# Patient Record
Sex: Female | Born: 1965
Health system: Southern US, Community
[De-identification: ages and names within clinical notes are randomized; demographics above are authoritative.]

## PROBLEM LIST (undated history)

## (undated) DIAGNOSIS — J45909 Unspecified asthma, uncomplicated: Secondary | ICD-10-CM

## (undated) DIAGNOSIS — D219 Benign neoplasm of connective and other soft tissue, unspecified: Secondary | ICD-10-CM

## (undated) DIAGNOSIS — E119 Type 2 diabetes mellitus without complications: Secondary | ICD-10-CM

## (undated) DIAGNOSIS — Z87442 Personal history of urinary calculi: Secondary | ICD-10-CM

## (undated) HISTORY — DX: Benign neoplasm of connective and other soft tissue, unspecified: D21.9

## (undated) HISTORY — PX: TONSILLECTOMY: SUR1361

## (undated) HISTORY — PX: DENTAL SURGERY: SHX609

## (undated) HISTORY — DX: Personal history of urinary calculi: Z87.442

---

## 2014-02-11 HISTORY — PX: LITHOTRIPSY: SUR834

## 2014-03-11 DIAGNOSIS — N200 Calculus of kidney: Secondary | ICD-10-CM | POA: Insufficient documentation

## 2014-04-07 DIAGNOSIS — E119 Type 2 diabetes mellitus without complications: Secondary | ICD-10-CM | POA: Insufficient documentation

## 2014-04-07 DIAGNOSIS — E559 Vitamin D deficiency, unspecified: Secondary | ICD-10-CM | POA: Insufficient documentation

## 2015-11-07 DIAGNOSIS — E119 Type 2 diabetes mellitus without complications: Secondary | ICD-10-CM | POA: Diagnosis not present

## 2015-11-07 DIAGNOSIS — J45909 Unspecified asthma, uncomplicated: Secondary | ICD-10-CM | POA: Diagnosis not present

## 2016-02-05 DIAGNOSIS — N202 Calculus of kidney with calculus of ureter: Secondary | ICD-10-CM | POA: Diagnosis not present

## 2016-02-05 DIAGNOSIS — J069 Acute upper respiratory infection, unspecified: Secondary | ICD-10-CM | POA: Diagnosis not present

## 2016-04-05 DIAGNOSIS — N281 Cyst of kidney, acquired: Secondary | ICD-10-CM | POA: Diagnosis not present

## 2016-04-05 DIAGNOSIS — K760 Fatty (change of) liver, not elsewhere classified: Secondary | ICD-10-CM | POA: Diagnosis not present

## 2016-04-05 DIAGNOSIS — N2889 Other specified disorders of kidney and ureter: Secondary | ICD-10-CM | POA: Diagnosis not present

## 2016-04-05 DIAGNOSIS — N200 Calculus of kidney: Secondary | ICD-10-CM | POA: Diagnosis not present

## 2016-04-23 DIAGNOSIS — B9689 Other specified bacterial agents as the cause of diseases classified elsewhere: Secondary | ICD-10-CM | POA: Diagnosis not present

## 2016-04-23 DIAGNOSIS — L918 Other hypertrophic disorders of the skin: Secondary | ICD-10-CM | POA: Diagnosis not present

## 2016-04-23 DIAGNOSIS — L02821 Furuncle of head [any part, except face]: Secondary | ICD-10-CM | POA: Diagnosis not present

## 2016-04-23 DIAGNOSIS — Z1283 Encounter for screening for malignant neoplasm of skin: Secondary | ICD-10-CM | POA: Diagnosis not present

## 2016-04-30 DIAGNOSIS — E1165 Type 2 diabetes mellitus with hyperglycemia: Secondary | ICD-10-CM | POA: Diagnosis not present

## 2016-04-30 DIAGNOSIS — Z7984 Long term (current) use of oral hypoglycemic drugs: Secondary | ICD-10-CM | POA: Diagnosis not present

## 2016-04-30 DIAGNOSIS — J45909 Unspecified asthma, uncomplicated: Secondary | ICD-10-CM | POA: Diagnosis not present

## 2016-05-09 DIAGNOSIS — N200 Calculus of kidney: Secondary | ICD-10-CM | POA: Diagnosis not present

## 2016-05-09 DIAGNOSIS — N281 Cyst of kidney, acquired: Secondary | ICD-10-CM | POA: Diagnosis not present

## 2016-06-04 DIAGNOSIS — K080 Exfoliation of teeth due to systemic causes: Secondary | ICD-10-CM | POA: Diagnosis not present

## 2016-06-27 DIAGNOSIS — K080 Exfoliation of teeth due to systemic causes: Secondary | ICD-10-CM | POA: Diagnosis not present

## 2016-07-17 DIAGNOSIS — Z0000 Encounter for general adult medical examination without abnormal findings: Secondary | ICD-10-CM | POA: Diagnosis not present

## 2016-07-17 DIAGNOSIS — E119 Type 2 diabetes mellitus without complications: Secondary | ICD-10-CM | POA: Diagnosis not present

## 2016-07-17 DIAGNOSIS — J45909 Unspecified asthma, uncomplicated: Secondary | ICD-10-CM | POA: Diagnosis not present

## 2016-07-17 DIAGNOSIS — E559 Vitamin D deficiency, unspecified: Secondary | ICD-10-CM | POA: Diagnosis not present

## 2016-07-17 DIAGNOSIS — Z23 Encounter for immunization: Secondary | ICD-10-CM | POA: Diagnosis not present

## 2016-08-09 DIAGNOSIS — E119 Type 2 diabetes mellitus without complications: Secondary | ICD-10-CM | POA: Diagnosis not present

## 2016-08-27 DIAGNOSIS — Z01419 Encounter for gynecological examination (general) (routine) without abnormal findings: Secondary | ICD-10-CM | POA: Diagnosis not present

## 2016-09-03 ENCOUNTER — Ambulatory Visit: Payer: Federal, State, Local not specified - PPO | Admitting: *Deleted

## 2016-09-12 ENCOUNTER — Other Ambulatory Visit: Payer: Self-pay | Admitting: Family Medicine

## 2016-09-12 DIAGNOSIS — Z1231 Encounter for screening mammogram for malignant neoplasm of breast: Secondary | ICD-10-CM

## 2016-09-20 ENCOUNTER — Ambulatory Visit
Admission: RE | Admit: 2016-09-20 | Discharge: 2016-09-20 | Disposition: A | Discharge status: Home / Self Care | Payer: Federal, State, Local not specified - PPO | Source: Ambulatory Visit | Attending: Family Medicine | Admitting: Family Medicine

## 2016-09-20 ENCOUNTER — Encounter: Payer: Self-pay | Admitting: Radiology

## 2016-09-20 DIAGNOSIS — Z1231 Encounter for screening mammogram for malignant neoplasm of breast: Secondary | ICD-10-CM | POA: Diagnosis not present

## 2016-10-30 ENCOUNTER — Encounter (HOSPITAL_COMMUNITY): Payer: Self-pay | Admitting: Emergency Medicine

## 2016-10-30 ENCOUNTER — Emergency Department (HOSPITAL_COMMUNITY): Payer: Federal, State, Local not specified - PPO

## 2016-10-30 DIAGNOSIS — K0889 Other specified disorders of teeth and supporting structures: Secondary | ICD-10-CM | POA: Insufficient documentation

## 2016-10-30 DIAGNOSIS — E119 Type 2 diabetes mellitus without complications: Secondary | ICD-10-CM | POA: Diagnosis not present

## 2016-10-30 DIAGNOSIS — J45909 Unspecified asthma, uncomplicated: Secondary | ICD-10-CM | POA: Diagnosis not present

## 2016-10-30 DIAGNOSIS — R079 Chest pain, unspecified: Secondary | ICD-10-CM | POA: Diagnosis not present

## 2016-10-30 DIAGNOSIS — R072 Precordial pain: Secondary | ICD-10-CM | POA: Diagnosis not present

## 2016-10-30 LAB — CBC
HEMATOCRIT: 38.2 % (ref 36.0–46.0)
Hemoglobin: 12.5 g/dL (ref 12.0–15.0)
MCH: 26.8 pg (ref 26.0–34.0)
MCHC: 32.7 g/dL (ref 30.0–36.0)
MCV: 82 fL (ref 78.0–100.0)
PLATELETS: 400 10*3/uL (ref 150–400)
RBC: 4.66 MIL/uL (ref 3.87–5.11)
RDW: 13.5 % (ref 11.5–15.5)
WBC: 14 10*3/uL — ABNORMAL HIGH (ref 4.0–10.5)

## 2016-10-30 LAB — BASIC METABOLIC PANEL
Anion gap: 8 (ref 5–15)
BUN: 12 mg/dL (ref 6–20)
CHLORIDE: 102 mmol/L (ref 101–111)
CO2: 27 mmol/L (ref 22–32)
CREATININE: 0.73 mg/dL (ref 0.44–1.00)
Calcium: 8.8 mg/dL — ABNORMAL LOW (ref 8.9–10.3)
GFR calc non Af Amer: >60 mL/min (ref >60)
Glucose, Bld: 158 mg/dL — ABNORMAL HIGH (ref 65–99)
POTASSIUM: 3.7 mmol/L (ref 3.5–5.1)
Sodium: 137 mmol/L (ref 135–145)

## 2016-10-30 LAB — I-STAT TROPONIN, ED: Troponin i, poc: 0.01 ng/mL (ref 0.00–0.08)

## 2016-10-30 IMAGING — CR DG CHEST 2V
2 series · 2 of 2 positions shown · non-contrast
Comparison: None available

CLINICAL DATA: Chest pain

EXAM:
CHEST  2 VIEW

[chest pa]
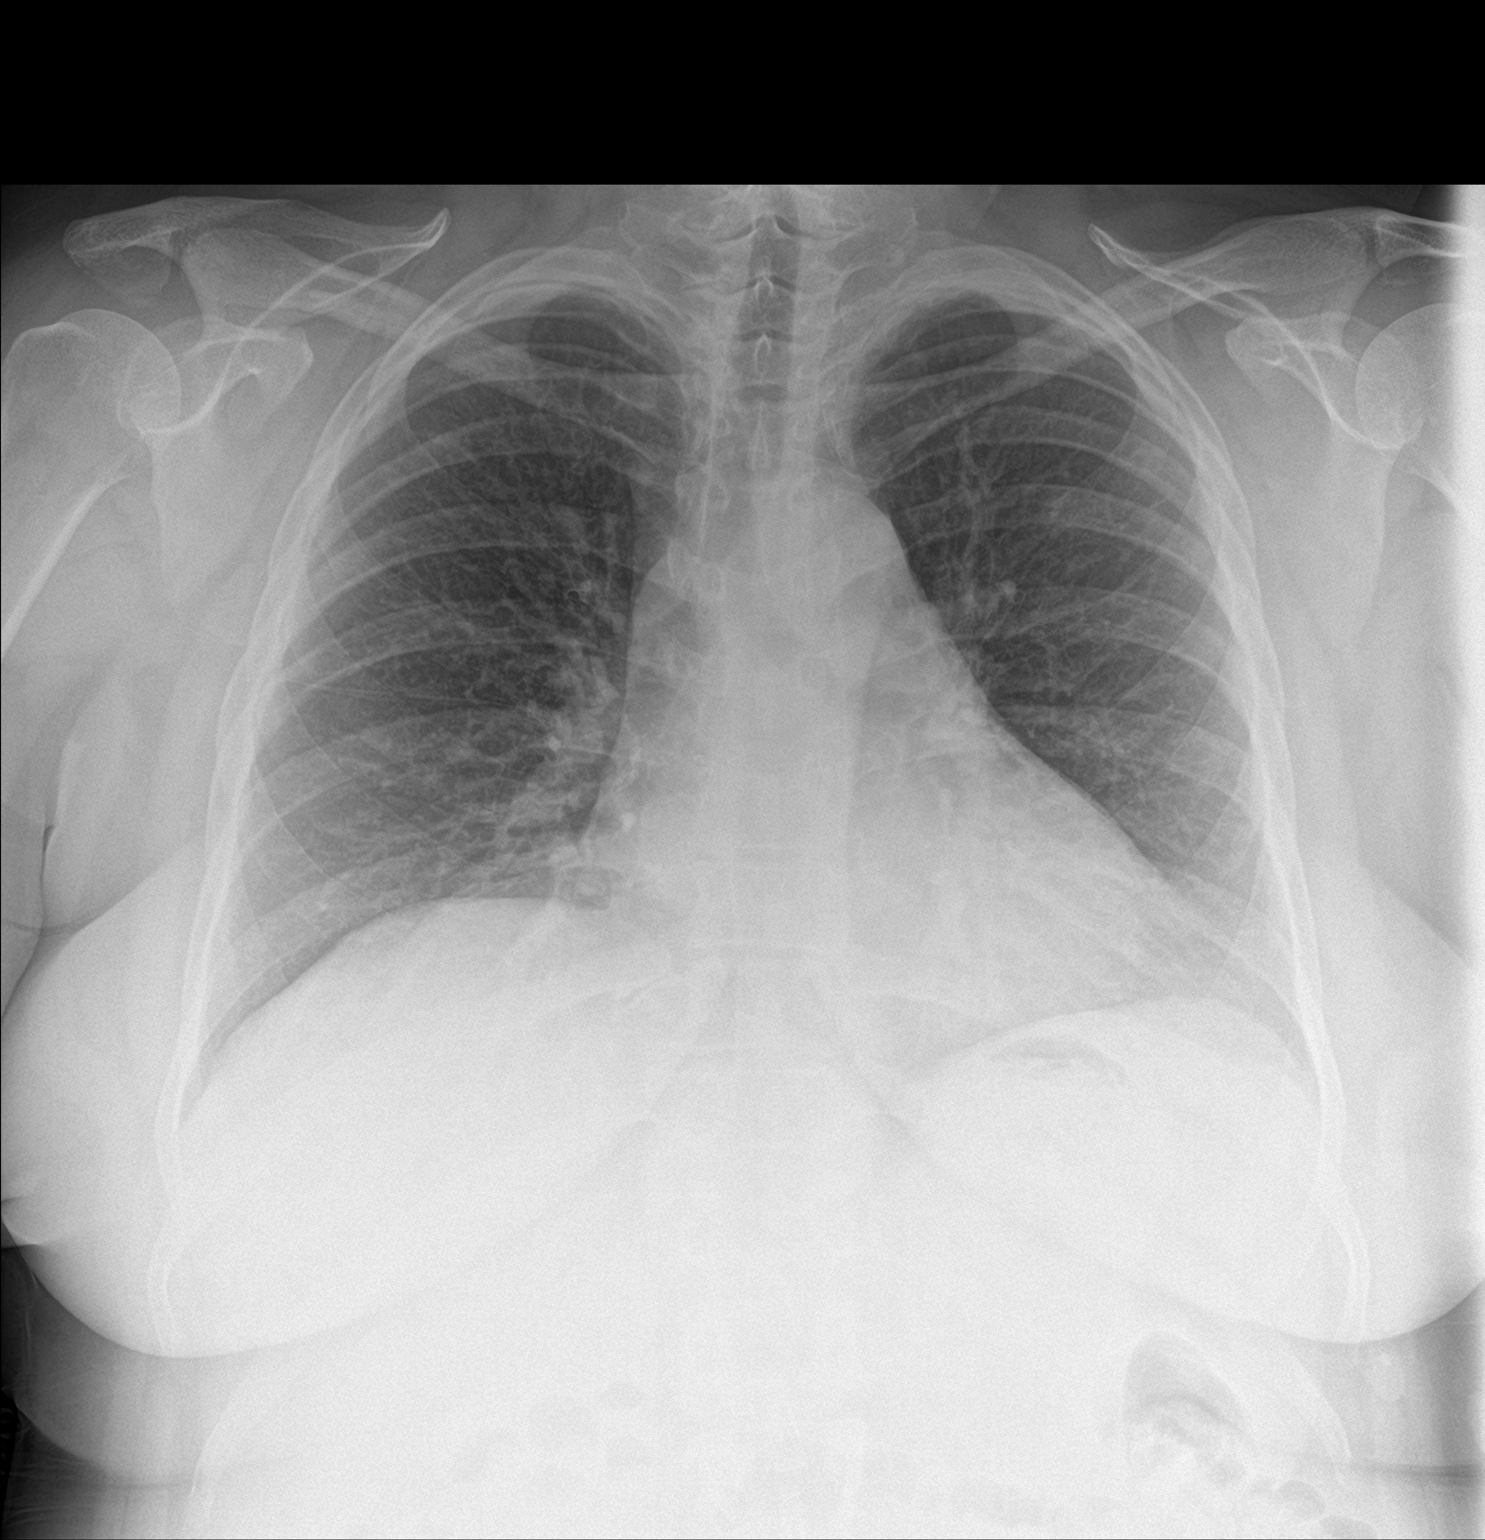

[chest lat]
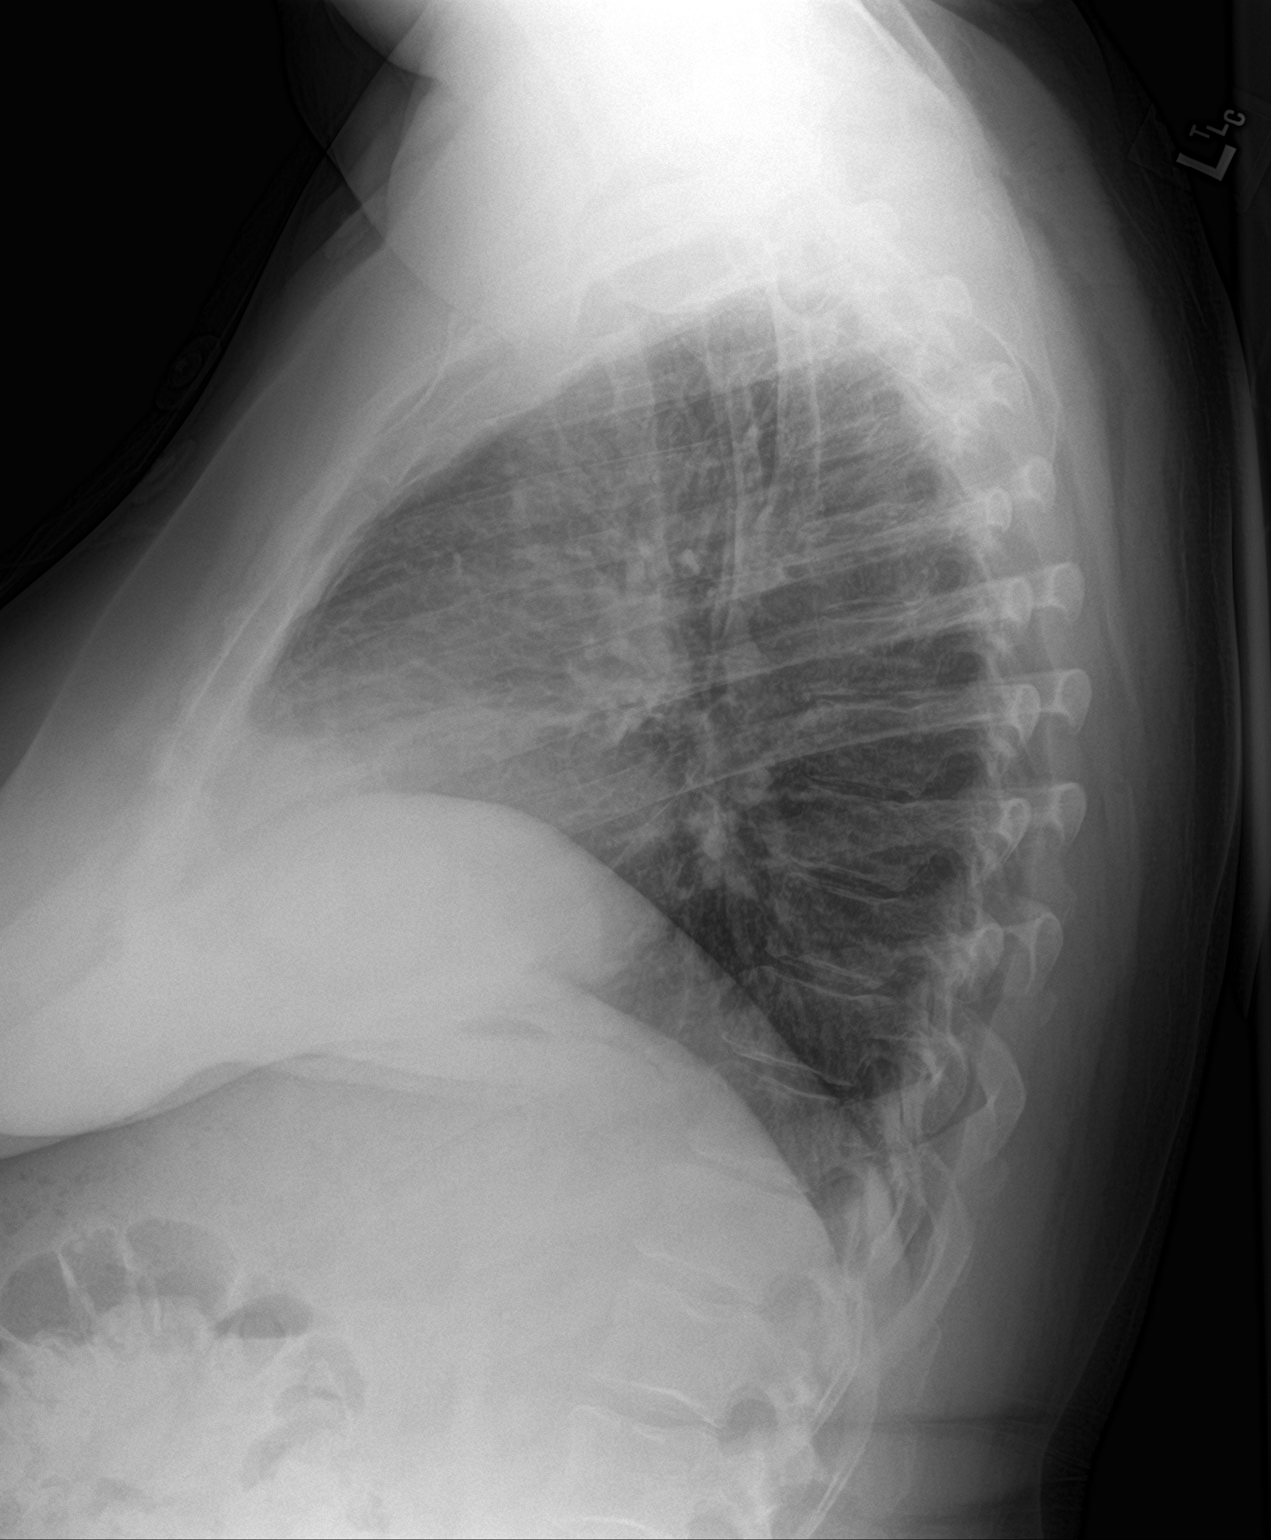

[2 of 2 positions shown; findings below may reference images not displayed]

FINDINGS: The heart size and mediastinal contours are within normal limits.
Both lungs are clear. The visualized skeletal structures are
unremarkable.
IMPRESSION: No active cardiopulmonary disease.

## 2016-10-30 NOTE — ED Triage Notes (Signed)
Patient reports left chest pain this morning with SOB , nausea and diaphoresis , family history of heart disease , occasional productive cough .

## 2016-10-31 ENCOUNTER — Emergency Department (HOSPITAL_COMMUNITY)
Admission: EM | Admit: 2016-10-31 | Discharge: 2016-10-31 | Disposition: A | Discharge status: Home / Self Care | Payer: Federal, State, Local not specified - PPO | Attending: Emergency Medicine | Admitting: Emergency Medicine

## 2016-10-31 DIAGNOSIS — R072 Precordial pain: Secondary | ICD-10-CM

## 2016-10-31 DIAGNOSIS — K0889 Other specified disorders of teeth and supporting structures: Secondary | ICD-10-CM

## 2016-10-31 HISTORY — DX: Unspecified asthma, uncomplicated: J45.909

## 2016-10-31 HISTORY — DX: Type 2 diabetes mellitus without complications: E11.9

## 2016-10-31 MED ORDER — PENICILLIN V POTASSIUM 500 MG PO TABS
500.0000 mg | ORAL_TABLET | Freq: Four times a day (QID) | ORAL | 0 refills | Status: AC
Start: 2016-10-31 — End: 2016-11-07

## 2016-10-31 NOTE — Discharge Instructions (Signed)

## 2016-10-31 NOTE — ED Provider Notes (Signed)
[facility] DEPT Provider Note   CSN: [id number] Arrival date & time: 10/30/16  1930     History   Chief Complaint Chief Complaint  Patient presents with  . Chest Pain    HPI FE OKUBO is a 51 y.o. female.  The history is provided by the patient.  Chest Pain   This is a new problem. The current episode started 12 to 24 hours ago. The problem occurs constantly. The problem has been resolved. The pain is present in the substernal region. The pain is moderate. The pain does not radiate. Duration of episode(s) is 2 hours. Associated symptoms include cough, diaphoresis, nausea and shortness of breath. Pertinent negatives include no hemoptysis, no syncope and no vomiting. She has tried nothing for the symptoms.  Her past medical history is significant for diabetes.  Her family medical history is significant for CAD.  pt reports recent cough/congestion, similar to prior asthma exacerbations.  However, earlier in the day she had 2 hrs episode of CP - she reports it felt like "an angel" was coming over her It resolved spontaneously She went to work without any issues She went to [facility] clinic who told her to go to ER She is now at baseline She reports mild cough but no recurrent CP No recent episodes of CP prior to today No h/o CAD/PE Family h/o CAD  Her job is very demanding and she is in hot/humid conditions but no CP with working   She reports recent issue with her teeth and requesting antibiotics  Past Medical History:  Diagnosis Date  . Asthma   . Diabetes mellitus without complication ([place])     There are no active problems to display for this patient.   Past Surgical History:  Procedure Laterality Date  . DENTAL SURGERY    . TONSILLECTOMY      OB History    No data available       Home Medications    Prior to Admission medications   Medication Sig Start Date End Date Taking? Authorizing Provider  penicillin v potassium (VEETID) 500 MG  tablet Take 1 tablet (500 mg total) by mouth 4 (four) times daily. 10/31/16 11/07/16  Ripley Fraise, MD    Family History No family history on file.  Social History Social History  Substance Use Topics  . Smoking status: Never Smoker  . Smokeless tobacco: Never Used  . Alcohol use Yes     Allergies   Patient has no known allergies.   Review of Systems Review of Systems  Constitutional: Positive for diaphoresis.  HENT: Positive for dental problem.   Respiratory: Positive for cough and shortness of breath. Negative for hemoptysis.   Cardiovascular: Positive for chest pain. Negative for syncope.  Gastrointestinal: Positive for nausea. Negative for vomiting.  All other systems reviewed and are negative.    Physical Exam Updated Vital Signs BP 125/78 (BP Location: Right Arm)   Pulse 76   Temp 98.8 F (37.1 C) (Oral)   Resp 14   SpO2 98%   Physical Exam CONSTITUTIONAL: Well developed/well nourished HEAD: Normocephalic/atraumatic EYES: EOMI/PERRL ENMT: Mucous membranes moist, no tenderness/abscess noted to gingiva NECK: supple no meningeal signs SPINE/BACK:entire spine nontender CV: S1/S2 noted, no murmurs/rubs/gallops noted LUNGS: Lungs are clear to auscultation bilaterally, no apparent distress ABDOMEN: soft, nontender, no rebound or guarding, bowel sounds noted throughout abdomen GU:no cva tenderness NEURO: Pt is awake/alert/appropriate, moves all extremitiesx4.  No facial droop.   EXTREMITIES: pulses normal/equal, full ROM SKIN: warm,  color normal PSYCH: no abnormalities of mood noted, alert and oriented to situation   ED Treatments / Results  Labs (all labs ordered are listed, but only abnormal results are displayed) Labs Reviewed  BASIC METABOLIC PANEL - Abnormal; Notable for the following:       Result Value   Glucose, Bld 158 (*)    Calcium 8.8 (*)    All other components within normal limits  CBC - Abnormal; Notable for the following:    WBC 14.0  (*)    All other components within normal limits  I-STAT TROPONIN, ED    EKG  EKG Interpretation  Date/Time:  Wednesday October 30 2016 19:40:43 EDT Ventricular Rate:  85 PR Interval:  154 QRS Duration: 70 QT Interval:  368 QTC Calculation: 437 R Axis:   98 Text Interpretation:  Normal sinus rhythm Rightward axis Borderline ECG No previous ECGs available Confirmed by Ripley Fraise [phone]) on 10/31/2016 1:33:47 AM       Radiology Dg Chest 2 View  Result Date: 10/30/2016 CLINICAL DATA:  Chest pain EXAM: CHEST  2 VIEW COMPARISON:  None available FINDINGS: The heart size and mediastinal contours are within normal limits. Both lungs are clear. The visualized skeletal structures are unremarkable. IMPRESSION: No active cardiopulmonary disease. Electronically Signed   By: Jerilynn Mages.  Shick M.D.   On: 10/30/2016 21:37    Procedures Procedures (including critical care time)  Medications Ordered in ED Medications - No data to display   Initial Impression / Assessment and Plan / ED Course  I have reviewed the triage vital signs and the nursing notes.  Pertinent labs & imaging results that were available during my care of the patient were reviewed by me and considered in my medical decision making (see chart for details).     Pt well appearing She had isolated episode of CP hours ago and went to work without problem EKG/troponin unremarkable I reviewed EKG from walkin clinic, no significant change I doubt ACS/PE/Dissection at this time   Will d/c home Advised close PCP followup and will need outpatient stress test Will start PCN at patient request for dental issues    Final Clinical Impressions(s) / ED Diagnoses   Final diagnoses:  Precordial pain  Pain, dental    New Prescriptions Discharge Medication List as of 10/31/2016  1:45 AM    START taking these medications   Details  penicillin v potassium (VEETID) 500 MG tablet Take 1 tablet (500 mg total) by mouth 4 (four)  times daily., Starting Thu 10/31/2016, Until Thu 11/07/2016, Print         Ripley Fraise, MD 10/31/16 [phone]

## 2016-11-01 DIAGNOSIS — M62838 Other muscle spasm: Secondary | ICD-10-CM | POA: Diagnosis not present

## 2016-11-01 DIAGNOSIS — R079 Chest pain, unspecified: Secondary | ICD-10-CM | POA: Diagnosis not present

## 2016-11-01 DIAGNOSIS — I781 Nevus, non-neoplastic: Secondary | ICD-10-CM | POA: Diagnosis not present

## 2016-11-01 DIAGNOSIS — D72829 Elevated white blood cell count, unspecified: Secondary | ICD-10-CM | POA: Diagnosis not present

## 2016-11-01 DIAGNOSIS — E1165 Type 2 diabetes mellitus with hyperglycemia: Secondary | ICD-10-CM | POA: Diagnosis not present

## 2016-11-06 DIAGNOSIS — K080 Exfoliation of teeth due to systemic causes: Secondary | ICD-10-CM | POA: Diagnosis not present

## 2016-11-07 DIAGNOSIS — H44539 Leucocoria, unspecified eye: Secondary | ICD-10-CM | POA: Diagnosis not present

## 2016-11-14 ENCOUNTER — Telehealth: Payer: Self-pay

## 2016-11-14 ENCOUNTER — Telehealth: Payer: Self-pay | Admitting: Cardiovascular Disease

## 2016-11-14 NOTE — Telephone Encounter (Signed)
Notes sent to scheduling.   

## 2016-11-14 NOTE — Telephone Encounter (Signed)
Received records from [facility] for appointment on 11/15/16 with Dr Gwenlyn Found.  Records put with Dr Kennon Holter schedule for 11/15/16. lp

## 2016-11-15 ENCOUNTER — Ambulatory Visit (INDEPENDENT_AMBULATORY_CARE_PROVIDER_SITE_OTHER): Payer: Federal, State, Local not specified - PPO | Admitting: Cardiovascular Disease

## 2016-11-15 ENCOUNTER — Encounter: Payer: Self-pay | Admitting: Cardiovascular Disease

## 2016-11-15 VITALS — BP 117/71 | HR 82 | Ht 64.0 in | Wt 222.0 lb

## 2016-11-15 DIAGNOSIS — R079 Chest pain, unspecified: Secondary | ICD-10-CM

## 2016-11-15 DIAGNOSIS — Z8249 Family history of ischemic heart disease and other diseases of the circulatory system: Secondary | ICD-10-CM | POA: Diagnosis not present

## 2016-11-15 DIAGNOSIS — R0789 Other chest pain: Secondary | ICD-10-CM | POA: Diagnosis not present

## 2016-11-15 NOTE — Patient Instructions (Signed)
Medication Instructions: Your physician recommends that you continue on your current medications as directed. Please refer to the Current Medication list given to you today.  Testing/Procedures: Your physician has requested that you have an echocardiogram. Echocardiography is a painless test that uses sound waves to create images of your heart. It provides your doctor with information about the size and shape of your heart and how well your heart's chambers and valves are working. This procedure takes approximately one hour. There are no restrictions for this procedure.  Your physician has requested that you have an exercise stress myoview. For further information please visit [email or web address]. Please follow instruction sheet, as given.  Follow-Up: Your physician recommends that you schedule a follow-up appointment as needed with Dr. Gwenlyn Found unless results are abnormal.  If you need a refill on your cardiac medications before your next appointment, please call your pharmacy.

## 2016-11-15 NOTE — Assessment & Plan Note (Signed)
Sheri Lara was referred to me by Dr.Koirala for evaluation of atypical chest pain. Risk factors include family history with a mother who had bypass surgery in her 80s and type 2 diabetes on metformin. She has never had a heart attack or stroke. She does have reactive airways disease and does get occasional should chest tightness associated with coughing. She had an episode of prolonged chest pain on 10/30/16 which lasted several hours. She was evaluated at [facility] emergency room. Her enzymes were negative and her EKG showed no acute changes. She also complains of some shortness of breath on exertion. I'm going to the echocardiogram and exercise Myoview stress test to risk stratify her especially in light of her premature family history.

## 2016-11-15 NOTE — Progress Notes (Signed)
11/15/2016 Sheri Lara   05-Apr-1965  [id number]  Primary Physician Koirala, Dibas, MD Primary Cardiologist: Lorretta Harp MD Sheri Lara, [place]  HPI:  Sheri Lara is a 51 y.o. female married, with no children, who is accompanied by her husband Sheri Lara who is a patient of Dr. Lysbeth Penner. She was referred by Dr.Koirala for evaluation of atypical chest pain. Her risk factors included type 2 diabetes and family history with a mother who had bypass surgery in her 65s. She has never had a heart attack or stroke. She describes some dyspnea on exertion and some chest tightness associated with cough with coughing that she attributes to her reactive airways disease. She had prolonged episode of fairly localized left precordial chest pain on 10/30/16 that last for hours at a time. She was evaluated at [facility] emergency room where enzymes were negative and her EKG showed no acute changes.   Current Meds  Medication Sig  . metFORMIN (GLUMETZA) 500 MG (MOD) 24 hr tablet Take 500 mg by mouth.  Marland Kitchen omeprazole (PRILOSEC) 10 MG capsule Take 10 mg by mouth daily as needed.  . SYMBICORT 160-4.5 MCG/ACT inhaler TAKE 2 PUFFS BY MOUTH TWICE A DAY  . Vitamin D, Ergocalciferol, (DRISDOL) 50000 units CAPS capsule Take by mouth.     Allergies  Allergen Reactions  . Sulfa Antibiotics Hives and Rash    Social History   Social History  . Marital status: Unknown    Spouse name: N/A  . Number of children: N/A  . Years of education: N/A   Occupational History  . Not on file.   Social History Main Topics  . Smoking status: Never Smoker  . Smokeless tobacco: Never Used  . Alcohol use Yes  . Drug use: No  . Sexual activity: Not on file   Other Topics Concern  . Not on file   Social History Narrative  . No narrative on file     Review of Systems: General: negative for chills, fever, night sweats or weight changes.  Cardiovascular: negative for chest pain, dyspnea on exertion, edema,  orthopnea, palpitations, paroxysmal nocturnal dyspnea or shortness of breath Dermatological: negative for rash Respiratory: negative for cough or wheezing Urologic: negative for hematuria Abdominal: negative for nausea, vomiting, diarrhea, bright red blood per rectum, melena, or hematemesis Neurologic: negative for visual changes, syncope, or dizziness All other systems reviewed and are otherwise negative except as noted above.    Blood pressure 117/71, pulse 82, height 5\' 4"  (1.626 m), weight 222 lb (100.7 kg).  General appearance: alert and no distress Neck: no adenopathy, no carotid bruit, no JVD, supple, symmetrical, trachea midline and thyroid not enlarged, symmetric, no tenderness/mass/nodules Lungs: clear to auscultation bilaterally Heart: regular rate and rhythm, S1, S2 normal, no murmur, click, rub or gallop Extremities: extremities normal, atraumatic, no cyanosis or edema Pulses: 2+ and symmetric Skin: Skin color, texture, turgor normal. No rashes or lesions Neurologic: Alert and oriented X 3, normal strength and tone. Normal symmetric reflexes. Normal coordination and gait  EKG not performed today  ASSESSMENT AND PLAN:   Atypical chest pain Ms. Stringfield was referred to me by Dr.Koirala for evaluation of atypical chest pain. Risk factors include family history with a mother who had bypass surgery in her 68s and type 2 diabetes on metformin. She has never had a heart attack or stroke. She does have reactive airways disease and does get occasional should chest tightness associated with coughing. She had an episode  of prolonged chest pain on 10/30/16 which lasted several hours. She was evaluated at [facility] emergency room. Her enzymes were negative and her EKG showed no acute changes. She also complains of some shortness of breath on exertion. I'm going to the echocardiogram and exercise Myoview stress test to risk stratify her especially in light of her premature family  history.      Lorretta Harp MD FACP,FACC,FAHA, [facility] 11/15/2016 8:43 AM

## 2016-11-26 DIAGNOSIS — K080 Exfoliation of teeth due to systemic causes: Secondary | ICD-10-CM | POA: Diagnosis not present

## 2016-11-27 ENCOUNTER — Other Ambulatory Visit (HOSPITAL_COMMUNITY): Payer: Federal, State, Local not specified - PPO

## 2016-11-27 DIAGNOSIS — E119 Type 2 diabetes mellitus without complications: Secondary | ICD-10-CM | POA: Diagnosis not present

## 2016-11-27 DIAGNOSIS — D72829 Elevated white blood cell count, unspecified: Secondary | ICD-10-CM | POA: Diagnosis not present

## 2016-11-27 DIAGNOSIS — Z23 Encounter for immunization: Secondary | ICD-10-CM | POA: Diagnosis not present

## 2016-11-28 ENCOUNTER — Encounter (INDEPENDENT_AMBULATORY_CARE_PROVIDER_SITE_OTHER): Payer: Self-pay

## 2016-11-28 ENCOUNTER — Ambulatory Visit (HOSPITAL_COMMUNITY): Payer: Federal, State, Local not specified - PPO | Attending: Cardiovascular Disease

## 2016-11-28 ENCOUNTER — Other Ambulatory Visit: Payer: Self-pay

## 2016-11-28 DIAGNOSIS — Z8249 Family history of ischemic heart disease and other diseases of the circulatory system: Secondary | ICD-10-CM | POA: Insufficient documentation

## 2016-11-28 DIAGNOSIS — R0600 Dyspnea, unspecified: Secondary | ICD-10-CM | POA: Insufficient documentation

## 2016-11-28 DIAGNOSIS — E119 Type 2 diabetes mellitus without complications: Secondary | ICD-10-CM | POA: Insufficient documentation

## 2016-11-28 DIAGNOSIS — R079 Chest pain, unspecified: Secondary | ICD-10-CM | POA: Diagnosis not present

## 2016-11-28 DIAGNOSIS — J45909 Unspecified asthma, uncomplicated: Secondary | ICD-10-CM | POA: Diagnosis not present

## 2016-12-05 ENCOUNTER — Other Ambulatory Visit (HOSPITAL_COMMUNITY): Payer: Federal, State, Local not specified - PPO

## 2016-12-05 ENCOUNTER — Inpatient Hospital Stay (HOSPITAL_COMMUNITY): Admission: RE | Admit: 2016-12-05 | Payer: Federal, State, Local not specified - PPO | Source: Ambulatory Visit

## 2016-12-13 ENCOUNTER — Telehealth (HOSPITAL_COMMUNITY): Payer: Self-pay

## 2016-12-13 NOTE — Telephone Encounter (Signed)
Encounter complete. 

## 2016-12-17 ENCOUNTER — Telehealth (HOSPITAL_COMMUNITY): Payer: Self-pay

## 2016-12-17 NOTE — Telephone Encounter (Signed)
Encounter complete. 

## 2016-12-18 ENCOUNTER — Ambulatory Visit (HOSPITAL_COMMUNITY)
Admission: RE | Admit: 2016-12-18 | Discharge: 2016-12-18 | Disposition: A | Discharge status: Home / Self Care | Payer: Federal, State, Local not specified - PPO | Source: Ambulatory Visit | Attending: Cardiology | Admitting: Cardiology

## 2016-12-18 DIAGNOSIS — Z1211 Encounter for screening for malignant neoplasm of colon: Secondary | ICD-10-CM | POA: Diagnosis not present

## 2016-12-18 DIAGNOSIS — R079 Chest pain, unspecified: Secondary | ICD-10-CM | POA: Diagnosis not present

## 2016-12-18 LAB — MYOCARDIAL PERFUSION IMAGING
CHL CUP MPHR: 167 {beats}/min
CHL CUP NUCLEAR SRS: 0
CHL CUP NUCLEAR SSS: 3
CHL RATE OF PERCEIVED EXERTION: 18
CSEPED: 8 min
CSEPEDS: 31 s
CSEPEW: 10.4 METS
CSEPPHR: 162 {beats}/min
LV dias vol: 85 mL (ref 46–106)
LVSYSVOL: 31 mL
NUC STRESS TID: 1.25
Percent HR: 97 %
Rest HR: 62 {beats}/min
SDS: 3

## 2016-12-18 MED ORDER — TECHNETIUM TC 99M TETROFOSMIN IV KIT
8.2000 | PACK | Freq: Once | INTRAVENOUS | Status: DC | PRN
  Filled 2016-12-18: qty 9

## 2016-12-18 MED ORDER — TECHNETIUM TC 99M TETROFOSMIN IV KIT
26.0000 | PACK | Freq: Once | INTRAVENOUS | Status: DC | PRN
  Filled 2016-12-18: qty 26

## 2016-12-20 ENCOUNTER — Telehealth: Payer: Self-pay | Admitting: *Deleted

## 2016-12-20 NOTE — Telephone Encounter (Signed)
   [facility] Pre-operative Risk Assessment    Request for surgical clearance:  1. What type of surgery is being performed? COLONOSCOPY   2. When is this surgery scheduled? 01/09/17   3. Are there any medications that need to be held prior to surgery and how long?NONE   4. Practice name and name of physician performing surgery? PATRICK HUNG MD   5. What is your office phone and fax number? PH=[phone] FX=[phone]   6. Anesthesia type (None, local, MAC, general) ? PROPFOL   Fredia Beets 12/20/2016, 7:53 AM  _________________________________________________________________   (provider comments below)

## 2016-12-20 NOTE — Telephone Encounter (Signed)
   Chart reviewed as part of pre-operative protocol coverage. Given past medical history and recent low risk stress test, based on ACC/AHA guidelines, Sheri Lara would be at acceptable risk for the planned procedure without further cardiovascular testing.   Murray Hodgkins, NP 12/20/2016, 3:46 PM

## 2016-12-31 DIAGNOSIS — D72829 Elevated white blood cell count, unspecified: Secondary | ICD-10-CM | POA: Diagnosis not present

## 2017-01-07 DIAGNOSIS — K080 Exfoliation of teeth due to systemic causes: Secondary | ICD-10-CM | POA: Diagnosis not present

## 2017-01-09 DIAGNOSIS — K5730 Diverticulosis of large intestine without perforation or abscess without bleeding: Secondary | ICD-10-CM | POA: Diagnosis not present

## 2017-01-09 DIAGNOSIS — Z1211 Encounter for screening for malignant neoplasm of colon: Secondary | ICD-10-CM | POA: Diagnosis not present

## 2017-01-24 DIAGNOSIS — M7732 Calcaneal spur, left foot: Secondary | ICD-10-CM | POA: Diagnosis not present

## 2017-01-24 DIAGNOSIS — M71572 Other bursitis, not elsewhere classified, left ankle and foot: Secondary | ICD-10-CM | POA: Diagnosis not present

## 2017-01-24 DIAGNOSIS — M722 Plantar fascial fibromatosis: Secondary | ICD-10-CM | POA: Diagnosis not present

## 2017-01-27 DIAGNOSIS — M722 Plantar fascial fibromatosis: Secondary | ICD-10-CM | POA: Diagnosis not present

## 2017-01-27 DIAGNOSIS — M71572 Other bursitis, not elsewhere classified, left ankle and foot: Secondary | ICD-10-CM | POA: Diagnosis not present

## 2017-02-03 DIAGNOSIS — M71572 Other bursitis, not elsewhere classified, left ankle and foot: Secondary | ICD-10-CM | POA: Diagnosis not present

## 2017-02-03 DIAGNOSIS — M722 Plantar fascial fibromatosis: Secondary | ICD-10-CM | POA: Diagnosis not present

## 2017-02-10 DIAGNOSIS — B351 Tinea unguium: Secondary | ICD-10-CM | POA: Diagnosis not present

## 2017-02-10 DIAGNOSIS — M71572 Other bursitis, not elsewhere classified, left ankle and foot: Secondary | ICD-10-CM | POA: Diagnosis not present

## 2017-03-17 DIAGNOSIS — B351 Tinea unguium: Secondary | ICD-10-CM | POA: Diagnosis not present

## 2017-04-02 DIAGNOSIS — E1165 Type 2 diabetes mellitus with hyperglycemia: Secondary | ICD-10-CM | POA: Diagnosis not present

## 2017-04-02 DIAGNOSIS — Z7984 Long term (current) use of oral hypoglycemic drugs: Secondary | ICD-10-CM | POA: Diagnosis not present

## 2017-04-04 DIAGNOSIS — Z6838 Body mass index (BMI) 38.0-38.9, adult: Secondary | ICD-10-CM | POA: Diagnosis not present

## 2017-04-04 DIAGNOSIS — E1165 Type 2 diabetes mellitus with hyperglycemia: Secondary | ICD-10-CM | POA: Diagnosis not present

## 2017-04-18 DIAGNOSIS — B351 Tinea unguium: Secondary | ICD-10-CM | POA: Diagnosis not present

## 2017-05-04 DIAGNOSIS — L0201 Cutaneous abscess of face: Secondary | ICD-10-CM | POA: Diagnosis not present

## 2017-05-06 DIAGNOSIS — B9689 Other specified bacterial agents as the cause of diseases classified elsewhere: Secondary | ICD-10-CM | POA: Diagnosis not present

## 2017-05-06 DIAGNOSIS — L0202 Furuncle of face: Secondary | ICD-10-CM | POA: Diagnosis not present

## 2017-05-08 DIAGNOSIS — N281 Cyst of kidney, acquired: Secondary | ICD-10-CM | POA: Diagnosis not present

## 2017-05-13 DIAGNOSIS — N281 Cyst of kidney, acquired: Secondary | ICD-10-CM | POA: Diagnosis not present

## 2017-05-13 DIAGNOSIS — N200 Calculus of kidney: Secondary | ICD-10-CM | POA: Diagnosis not present

## 2017-05-22 DIAGNOSIS — B351 Tinea unguium: Secondary | ICD-10-CM | POA: Diagnosis not present

## 2017-05-28 DIAGNOSIS — L720 Epidermal cyst: Secondary | ICD-10-CM | POA: Diagnosis not present

## 2017-06-02 NOTE — Progress Notes (Signed)
[facility]   Patient Details  Name: Sheri Lara MRN: [id number] Date of Birth: 10-04-1965 Age: 52 y.o. PCP: Sheri Amel, MD  Vitals:   06/02/17 1323  BP: 124/70  Pulse: 74  Resp: 18  SpO2: 97%  Weight: 220 lb 6.4 oz (100 kg)  Height: 5\' 4"  (1.626 m)     Sheri Lara Eval - 06/02/17 1300      Referral    Referring Provider  self/Sheri Lara    Reason for referral  Diabetes;Family History;Inactivity;Obesitity/Overweight;Orthopedic      Measurement   Waist Circumference  47 inches    Hip Circumference  44 inches    Body fat  44.9 percent      Information for Trainer   Goals  "loose 15lbs, strengthen core & knees"    Current Exercise  walks w/job intermittently    Orthopedic Concerns  B knees, core    Pertinent Medical History  diabetic, asthma, allergies    Current Barriers  time d/t work    Restrictions/Precautions  Diabetic snack before exercise    Medications that affect exercise  Asthma inhaler      Mobility and Daily Activities   I find it easy to walk up or down two or more flights of stairs.  2    I have no trouble taking out the trash.  4    I do housework such as vacuuming and dusting on my own without difficulty.  4    I can easily lift a gallon of milk (8lbs).  4    I can easily walk a mile.  3    I have no trouble reaching into high cupboards or reaching down to pick up something from the floor.  2    I do not have trouble doing out-door work such as Sheri Lara, raking leaves, or gardening.  3      Mobility and Daily Activities   I feel younger than my age.  2    I feel independent.  4    I feel energetic.  2    I live an active life.   2    I feel strong.  2    I feel healthy.  2    I feel active as other people my age.  2      How fit and strong are you.   Fit and Strong Total Score  38      Past Medical History:  Diagnosis Date  . Asthma   . Diabetes mellitus without complication Sheri Lara)    Past Surgical  History:  Procedure Laterality Date  . DENTAL SURGERY    . TONSILLECTOMY     Social History   Tobacco Use  Smoking Status Never Smoker  Smokeless Tobacco Never Used     Sheri Lara has registered for the PREP on 05/29/17 with her husband and will begin the twice/weekly classes from 6pm-7pm tonight.   Sheri Lara 06/02/2017, 1:27 PM

## 2017-06-03 DIAGNOSIS — J4540 Moderate persistent asthma, uncomplicated: Secondary | ICD-10-CM | POA: Diagnosis not present

## 2017-06-03 DIAGNOSIS — R05 Cough: Secondary | ICD-10-CM | POA: Diagnosis not present

## 2017-06-03 DIAGNOSIS — K219 Gastro-esophageal reflux disease without esophagitis: Secondary | ICD-10-CM | POA: Diagnosis not present

## 2017-06-03 DIAGNOSIS — J309 Allergic rhinitis, unspecified: Secondary | ICD-10-CM | POA: Diagnosis not present

## 2017-06-04 DIAGNOSIS — L089 Local infection of the skin and subcutaneous tissue, unspecified: Secondary | ICD-10-CM | POA: Diagnosis not present

## 2017-06-04 DIAGNOSIS — E1165 Type 2 diabetes mellitus with hyperglycemia: Secondary | ICD-10-CM | POA: Diagnosis not present

## 2017-06-04 DIAGNOSIS — N2889 Other specified disorders of kidney and ureter: Secondary | ICD-10-CM | POA: Diagnosis not present

## 2017-06-09 DIAGNOSIS — N200 Calculus of kidney: Secondary | ICD-10-CM | POA: Diagnosis not present

## 2017-06-09 NOTE — Progress Notes (Signed)
[facility] [employer] PREP Weekly Session   Patient Details  Name: Sheri Lara MRN: [id number] Date of Birth: [birth year] Age: 52 y.o. PCP: Lujean Amel, MD  Vitals:   06/09/17 1318  Weight: 220 lb 9.6 oz (100.1 kg)    [facility] Weekly seesion - 06/09/17 1300      Weekly Session   Topic Discussed  Goal setting and welcome to the program    Classes attended to date  1      Fun things you've done this week:"gardening"   Sheri Lara 06/09/2017, 1:18 PM

## 2017-06-16 NOTE — Progress Notes (Signed)
[facility] PREP Weekly Session   Patient Details  Name: Sheri Lara MRN: [id number] Date of Birth: Sep 18, 1965 Age: 52 y.o. PCP: Lujean Amel, MD  Vitals:   06/09/17 1244  Weight: 222 lb 3.2 oz (100.8 kg)    [facility] [employer] Weekly seesion - 06/16/17 1200      Weekly Session   Topic Discussed  Other ways to be active    Minutes exercised this week  40 minutes strength   strength   Classes attended to date  [place] 06/16/2017, 12:45 PM

## 2017-06-23 NOTE — Progress Notes (Signed)
[facility] PREP Weekly Session   Patient Details  Name: Sheri Lara MRN: [id number] Date of Birth: 1965-11-02 Age: 52 y.o. PCP: Lujean Amel, MD  Vitals:   06/23/17 1358  Weight: 218 lb 9.6 oz (99.2 kg)    [facility] Weekly seesion - 06/23/17 1300      Weekly Session   Topic Discussed  Healthy eating tips    Minutes exercised this week  45 minutes 10cardio/30strength/36flexibility   10cardio/30strength/68flexibility   Classes attended to date  [place] 06/23/2017, 1:58 PM

## 2017-06-25 NOTE — Progress Notes (Signed)
[facility] PREP Weekly Session   Patient Details  Name: Sheri Lara MRN: [id number] Date of Birth: 03-Apr-1965 Age: 52 y.o. PCP: Lujean Amel, MD  Vitals:   06/23/17 1053  Weight: 220 lb (99.8 kg)    [facility] Weekly seesion - 06/25/17 1000      Weekly Session   Topic Discussed  Other ways to be active    Minutes exercised this week  140 minutes 40cardio/90strength/2flexibility   40cardio/90strength/40flexibility   Classes attended to date  4      Nutrition celebrations:"yogurt/granola"  Vanita Ingles 06/25/2017, 10:53 AM

## 2017-07-03 DIAGNOSIS — E1165 Type 2 diabetes mellitus with hyperglycemia: Secondary | ICD-10-CM | POA: Diagnosis not present

## 2017-07-04 NOTE — Progress Notes (Signed)
[facility] PREP Weekly Session   Patient Details  Name: Sheri Lara MRN: [id number] Date of Birth: 10-06-1965 Age: 52 y.o. PCP: Lujean Amel, MD  Vitals:   06/30/17 1405  Weight: 221 lb (100.2 kg)    [facility] Weekly seesion - 07/04/17 1400      Weekly Session   Topic Discussed  Restaurant Eating    Minutes exercised this week  135 minutes 15cardio/120strength   15cardio/120strength     Fun things you've done since last meeting:"build a fence"   Vanita Ingles 07/04/2017, 2:06 PM

## 2017-07-11 NOTE — Progress Notes (Signed)
[facility] PREP Weekly Session   Patient Details  Name: Sheri Lara MRN: [id number] Date of Birth: 03-Feb-1966 Age: 52 y.o. PCP: Lujean Amel, MD  Vitals:   07/07/17 3383  Weight: 216 lb 12.8 oz (98.3 kg)    [facility] Weekly seesion - 07/11/17 0900      Weekly Session   Topic Discussed  Other    Minutes exercised this week  220 minutes 60cardio/150strength/20flexibility   60cardio/150strength/15flexibility       Vanita Ingles 07/11/2017, 9:38 AM

## 2017-07-21 NOTE — Progress Notes (Signed)
[facility] PREP Weekly Session   Patient Details  Name: Sheri Lara MRN: [id number] Date of Birth: [birth year] Age: 52 y.o. PCP: Lujean Amel, MD  Vitals:   07/14/17 1231  Weight: 218 lb (98.9 kg)    [facility] Weekly seesion - 07/21/17 1200      Weekly Session   Topic Discussed  Stress management and problem solving    Minutes exercised this week  245 minutes 60cardio/150strength/28flexiblity   60cardio/150strength/36flexiblity       Vanita Ingles 07/21/2017, 12:32 PM

## 2017-07-28 NOTE — Progress Notes (Signed)
[facility] PREP Weekly Session   Patient Details  Name: Sheri Lara MRN: [id number] Date of Birth: 05/21/1965 Age: 52 y.o. PCP: Lujean Amel, MD  Vitals:   07/21/17 1143  Weight: 218 lb (98.9 kg)    [facility] Weekly seesion - 07/28/17 1100      Weekly Session   Topic Discussed  Importance of resistance training    Minutes exercised this week  195 minutes 60cardio/120strength/49flexibility   60cardio/120strength/68flexibility       Vanita Ingles 07/28/2017, 11:43 AM

## 2017-08-04 NOTE — Progress Notes (Signed)
[facility] PREP Weekly Session   Patient Details  Name: Sheri Lara MRN: [id number] Date of Birth: 1965/12/23 Age: 52 y.o. PCP: Lujean Amel, MD  Vitals:   07/28/17 1433  Weight: 219 lb (99.3 kg)    [facility] Weekly seesion - 08/04/17 1400      Weekly Session   Topic Discussed  Expectations and non-scale victories    Minutes exercised this week  -- "30cardio/2 strength"   "30cardio/2 strength"       Vanita Ingles 08/04/2017, 2:33 PM

## 2017-08-06 NOTE — Progress Notes (Signed)
[facility] PREP Weekly Session   Patient Details  Name: Sheri Lara MRN: [id number] Date of Birth: 1965-04-22 Age: 51 y.o. PCP: Lujean Amel, MD  Vitals:   08/04/17 1357  Weight: 218 lb (98.9 kg)    [facility] Weekly seesion - 08/06/17 1300      Weekly Session   Topic Discussed  Other Portion control   Portion control   Minutes exercised this week  180 minutes 30cardio/120strength   30cardio/120strength       Vanita Ingles 08/06/2017, 1:58 PM

## 2017-09-09 DIAGNOSIS — K080 Exfoliation of teeth due to systemic causes: Secondary | ICD-10-CM | POA: Diagnosis not present

## 2017-09-10 DIAGNOSIS — Z01419 Encounter for gynecological examination (general) (routine) without abnormal findings: Secondary | ICD-10-CM | POA: Diagnosis not present

## 2017-09-17 DIAGNOSIS — K080 Exfoliation of teeth due to systemic causes: Secondary | ICD-10-CM | POA: Diagnosis not present

## 2017-10-14 DIAGNOSIS — M7732 Calcaneal spur, left foot: Secondary | ICD-10-CM | POA: Diagnosis not present

## 2017-10-14 DIAGNOSIS — M722 Plantar fascial fibromatosis: Secondary | ICD-10-CM | POA: Diagnosis not present

## 2017-12-17 ENCOUNTER — Telehealth: Payer: Self-pay | Admitting: Podiatry

## 2017-12-17 NOTE — Telephone Encounter (Signed)
Pt called wanting medical records release form emailed to her at [email or web address]. At 5:03 pm I called pt back and left a voicemail asking her to call us back to confirm if she is keeping her appointment with [place] tomorrow at 4 pm or not. Asked her to call us back at [phone].

## 2017-12-18 ENCOUNTER — Ambulatory Visit: Payer: Federal, State, Local not specified - PPO | Admitting: Podiatry

## 2017-12-18 ENCOUNTER — Ambulatory Visit (INDEPENDENT_AMBULATORY_CARE_PROVIDER_SITE_OTHER): Payer: Federal, State, Local not specified - PPO

## 2017-12-18 ENCOUNTER — Encounter: Payer: Self-pay | Admitting: Podiatry

## 2017-12-18 DIAGNOSIS — M7662 Achilles tendinitis, left leg: Secondary | ICD-10-CM | POA: Diagnosis not present

## 2017-12-18 DIAGNOSIS — M79672 Pain in left foot: Secondary | ICD-10-CM | POA: Diagnosis not present

## 2017-12-18 DIAGNOSIS — M779 Enthesopathy, unspecified: Secondary | ICD-10-CM

## 2017-12-18 DIAGNOSIS — M7661 Achilles tendinitis, right leg: Secondary | ICD-10-CM

## 2017-12-18 MED ORDER — DICLOFENAC SODIUM 75 MG PO TBEC: 75.0000 mg | DELAYED_RELEASE_TABLET | Freq: Two times a day (BID) | ORAL | 2 refills | Status: DC

## 2017-12-18 MED ORDER — TRIAMCINOLONE ACETONIDE 10 MG/ML IJ SUSP
10.0000 mg | Freq: Once | INTRAMUSCULAR | Status: AC
  Administered 2017-12-18: 10 mg

## 2017-12-18 NOTE — Progress Notes (Signed)
Subjective:   Patient ID: Sheri Lara, female   DOB: 52 y.o.   MRN: [id number]   HPI Patient presents stating she is had a lot of pain in the back of her left heel for the last several months and had previous devices given to her with a small injection which was not successful.  She was not immobilized and states that she started with pain last February and developed inflammation bottom of her heel which is gotten better but the back is really bothering her.  Also getting some pain in the front of her foot from walking on her toes she is try to get off her foot.  Patient has good digital perfusion and was noted to not smoke and would like to be more active   Review of Systems  All other systems reviewed and are negative.       Objective:  Physical Exam  Constitutional: She appears well-developed and well-nourished.  Cardiovascular: Intact distal pulses.  Pulmonary/Chest: Effort normal.  Musculoskeletal: Normal range of motion.  Neurological: She is alert.  Skin: Skin is warm.  Nursing note and vitals reviewed.   Neurovascular status intact muscle strength found to be adequate patient found to have exquisite discomfort posterior aspect of the left heel at the insertion of the Achilles tendon to the calcaneus lateral side.  It is quite inflamed in this area and there is no tendon dysfunction and no equinus condition noted.  Patient's found to have good digital perfusion and was well oriented x3 and has mild forefoot pain around the metatarsals     Assessment:  Acute Achilles tendinitis lateral side left heel with compensatory forefoot capsulitis     Plan:  H&P conditions reviewed and I discussed the importance of immobilization and patient does have an air fracture walker at home that she will start wearing.  I went ahead today and discussed injection explaining the risk of injection but I do not feel she got good results from the previous one and I did explain the risk of doing  this and she wants to accept this and I did careful injection of the lateral side of the posterior Achilles 3 mg dexamethasone Kenalog 5 mg Xylocaine and advised on immobilization taking some days off of work and will be seen back in 2 weeks.  Wrote prescription for diclofenac 75 mg twice daily  X-ray indicates small spur formation no indication stress fracture arthritis

## 2017-12-18 NOTE — Patient Instructions (Signed)

## 2017-12-22 ENCOUNTER — Other Ambulatory Visit: Payer: Federal, State, Local not specified - PPO | Admitting: Orthotics

## 2017-12-23 DIAGNOSIS — E1165 Type 2 diabetes mellitus with hyperglycemia: Secondary | ICD-10-CM | POA: Diagnosis not present

## 2017-12-23 DIAGNOSIS — M79676 Pain in unspecified toe(s): Secondary | ICD-10-CM

## 2018-01-01 ENCOUNTER — Encounter: Payer: Self-pay | Admitting: Podiatry

## 2018-01-01 ENCOUNTER — Ambulatory Visit (INDEPENDENT_AMBULATORY_CARE_PROVIDER_SITE_OTHER): Payer: Federal, State, Local not specified - PPO | Admitting: Orthotics

## 2018-01-01 ENCOUNTER — Ambulatory Visit: Payer: Federal, State, Local not specified - PPO | Admitting: Podiatry

## 2018-01-01 DIAGNOSIS — M779 Enthesopathy, unspecified: Secondary | ICD-10-CM

## 2018-01-01 DIAGNOSIS — M7662 Achilles tendinitis, left leg: Secondary | ICD-10-CM

## 2018-01-01 DIAGNOSIS — E1165 Type 2 diabetes mellitus with hyperglycemia: Secondary | ICD-10-CM | POA: Diagnosis not present

## 2018-01-01 DIAGNOSIS — Z23 Encounter for immunization: Secondary | ICD-10-CM | POA: Diagnosis not present

## 2018-01-01 MED ORDER — TRIAMCINOLONE ACETONIDE 10 MG/ML IJ SUSP
10.0000 mg | Freq: Once | INTRAMUSCULAR | Status: AC
  Administered 2018-01-01: 10 mg

## 2018-01-01 NOTE — Progress Notes (Signed)
Patient casted for f/o to address achilles tendonits per Dr. Paulla Dolly.  Plan on [place] 3D flex w/ 1/4" lift b/l.  41mm deep and wide.

## 2018-01-02 NOTE — Progress Notes (Signed)
Subjective:   Patient ID: Sheri Lara, female   DOB: 52 y.o.   MRN: [id number]   HPI Patient states that heel seems to be improved but there is discomfort in the left forefoot still noted when patient is active and states that it seems that is not improving   ROS      Objective:  Physical Exam  Neurovascular status intact with patient found to have inflammation pain of the left second MPJ with posterior pain that continues to improve with significant diminishment of discomfort     Assessment:  Improvement of Achilles tendinitis left with pain still noted to a slight degree with inflammatory capsulitis second MPJ which is not improved and may be compensatory from the Achilles     Plan:  H&P and reviewed condition.  At this point him to focus on the forefoot due to the continued exquisite inflammation and I did a forefoot block 60 mill grams like Marcaine mixture sterile prep of the area I aspirated the joint getting out a small amount of clear fluid and injected quarter cc Dexasone Kenalog into the second MPJ to reduce inflammation.  Discussed continued exercises for the Achilles tendon rigid bottom shoes and if symptoms persist will be seen back

## 2018-01-14 ENCOUNTER — Other Ambulatory Visit: Payer: Self-pay | Admitting: Podiatry

## 2018-01-14 DIAGNOSIS — M7661 Achilles tendinitis, right leg: Secondary | ICD-10-CM

## 2018-01-14 DIAGNOSIS — M7662 Achilles tendinitis, left leg: Secondary | ICD-10-CM

## 2018-01-21 ENCOUNTER — Ambulatory Visit (INDEPENDENT_AMBULATORY_CARE_PROVIDER_SITE_OTHER): Payer: Federal, State, Local not specified - PPO | Admitting: Orthotics

## 2018-01-21 DIAGNOSIS — M779 Enthesopathy, unspecified: Secondary | ICD-10-CM

## 2018-01-21 DIAGNOSIS — E1165 Type 2 diabetes mellitus with hyperglycemia: Secondary | ICD-10-CM | POA: Diagnosis not present

## 2018-01-21 DIAGNOSIS — Z23 Encounter for immunization: Secondary | ICD-10-CM | POA: Diagnosis not present

## 2018-01-21 DIAGNOSIS — M79672 Pain in left foot: Secondary | ICD-10-CM

## 2018-01-21 DIAGNOSIS — M7662 Achilles tendinitis, left leg: Secondary | ICD-10-CM

## 2018-01-21 NOTE — Progress Notes (Signed)
Patient came in today to pick up custom made foot orthotics.  The goals were accomplished and the patient reported no dissatisfaction with said orthotics.  Patient was advised of breakin period and how to report any issues. 

## 2018-02-20 DIAGNOSIS — M25562 Pain in left knee: Secondary | ICD-10-CM | POA: Diagnosis not present

## 2018-02-23 ENCOUNTER — Encounter: Payer: Self-pay | Admitting: Podiatry

## 2018-02-23 ENCOUNTER — Ambulatory Visit: Payer: Federal, State, Local not specified - PPO | Admitting: Podiatry

## 2018-02-23 DIAGNOSIS — M779 Enthesopathy, unspecified: Secondary | ICD-10-CM | POA: Diagnosis not present

## 2018-02-23 DIAGNOSIS — L600 Ingrowing nail: Secondary | ICD-10-CM | POA: Diagnosis not present

## 2018-02-23 MED ORDER — NEOMYCIN-POLYMYXIN-HC 3.5-10000-1 OT SOLN: OTIC | 0 refills | Status: DC

## 2018-02-23 NOTE — Patient Instructions (Signed)

## 2018-02-25 ENCOUNTER — Other Ambulatory Visit: Payer: Self-pay | Admitting: Podiatry

## 2018-02-25 MED ORDER — DICLOFENAC SODIUM 75 MG PO TBEC: 75.0000 mg | DELAYED_RELEASE_TABLET | Freq: Two times a day (BID) | ORAL | 1 refills | Status: DC

## 2018-03-02 NOTE — Progress Notes (Signed)
Subjective:   Patient ID: Sheri Lara, female   DOB: 53 y.o.   MRN: [id number]   HPI Patient states her heel is doing somewhat better but she is getting some pain in her forefoot that she is concerned about and also she has a painful nailbed on her right big toe over her left currently that is incurvated in the corner and is making shoe gear difficult.  States she is tried to soak it and trim it out herself without relief   ROS      Objective:  Physical Exam  Neurovascular status intact with inflammation of the forefoot  With heel improved quite a bit from previous treatment with incurvated nail bed right hallux lateral border that is painful when pressed and making walking in shoe gear difficult     Assessment:  Ingrown toenail deformity right hallux lateral border with inflammatory forefoot pain left localized in nature     Plan:  H&P precautionary x-ray of left reviewed with patient.  For the ingrown toenail I recommended correction I explained procedure risk and allowed patient to read and then sign consent form for correction.  I infiltrated the right hallux 60 mg like Marcaine mixture sterile prep applied to the right toe exposed matrix and applied phenol 3 applications 30 seconds followed by alcohol lavage and sterile dressing.  Gave instructions on soaks leaving dressing on 24 hours and taking it off earlier if any throbbing were to occur.  Wrote prescription for postoperative antibiotic drops and encouraged her to call with any questions concerns.  For the left foot I did review the forefoot and I do not recommend treatment currently but I gave advice on shoe gear modifications and rigid bottom shoes and if any issues were to occur we may need to address this  X-rays indicate that there is no signs of stress fracture left or indications of bone spur formation or arthritis

## 2018-03-09 ENCOUNTER — Encounter: Payer: Self-pay | Admitting: Podiatry

## 2018-03-09 ENCOUNTER — Ambulatory Visit (INDEPENDENT_AMBULATORY_CARE_PROVIDER_SITE_OTHER): Payer: Self-pay

## 2018-03-09 DIAGNOSIS — L600 Ingrowing nail: Secondary | ICD-10-CM

## 2018-03-09 MED ORDER — CEPHALEXIN 500 MG PO CAPS: 500.0000 mg | ORAL_CAPSULE | Freq: Three times a day (TID) | ORAL | 0 refills | Status: DC

## 2018-03-09 NOTE — Patient Instructions (Signed)

## 2018-03-16 ENCOUNTER — Encounter: Payer: Self-pay | Admitting: Podiatry

## 2018-03-16 ENCOUNTER — Ambulatory Visit (INDEPENDENT_AMBULATORY_CARE_PROVIDER_SITE_OTHER): Payer: Self-pay

## 2018-03-16 DIAGNOSIS — L600 Ingrowing nail: Secondary | ICD-10-CM

## 2018-03-16 NOTE — Patient Instructions (Signed)

## 2018-03-16 NOTE — Progress Notes (Signed)
Patient is here today for follow-up appointment, recent procedure performed on 02/23/2018, removal of ingrown hallux nail right foot.  She states that the area has been a little sore, and she is concerned with redness in the area.  Noted redness at the base of the nail, with yellow discharge coming from the nail.  Nail was sore to the touch and slightly swollen.  Order from Dr. Paulla Dolly Keflex 500 mg 3 times daily x8 days.  She is to continue soaking.  Bandage on during the day off at night.  We discussed that she is to report if symptoms do not improve, follow-up in 1 week or sooner if symptoms worsen.

## 2018-03-17 NOTE — Progress Notes (Signed)
Patient is here today for follow-up appointment, recent procedure performed on 02/23/2018, removal of ingrown hallux nail right foot.  She states that the soreness in the area has improved a lot since last week.  Noted redness at the base of the nail, but this has improved since last week.  No purulent drainage, no warmth to the area.  Redness appears to be due to an inflammatory response.  The nailbed that removed is beginning to scab over at this time, overall area appears to be improved and healing.    She is to discontinue soaking at this time, also discontinue otic drops for the toe.  I did advise her to start utilizing Neosporin and when she takes a shower to cleanse the area well.  She is to continue with her antibiotics, and follow-up next week to recheck or sooner with any acute symptom changes.

## 2018-03-23 ENCOUNTER — Encounter: Payer: Self-pay | Admitting: Podiatry

## 2018-03-23 ENCOUNTER — Ambulatory Visit (INDEPENDENT_AMBULATORY_CARE_PROVIDER_SITE_OTHER): Payer: Federal, State, Local not specified - PPO | Admitting: Podiatry

## 2018-03-23 DIAGNOSIS — L03032 Cellulitis of left toe: Secondary | ICD-10-CM

## 2018-03-23 MED ORDER — CEPHALEXIN 500 MG PO CAPS: 500.0000 mg | ORAL_CAPSULE | Freq: Three times a day (TID) | ORAL | 0 refills | Status: DC

## 2018-03-23 NOTE — Progress Notes (Signed)
Subjective:   Patient ID: Sheri Lara, female   DOB: 53 y.o.   MRN: [id number]   HPI Patient presents concerned about her nail and wants to make sure that there is no issues as far as healing goes   ROS      Objective:  Physical Exam  Neurovascular status intact with localized irritation of the nailbed left with no proximal edema erythema or drainage noted and crusted tissue in the medial portion of the nailbed     Assessment:  Very localized paronychia infection left hallux medial border with no proximal edema erythema drainage noted     Plan:  Reviewed paronychia infection as precautionary measure we will keep her on the amoxicillin antibiotic along with continued soaks and if this were to persist we can have to open the area up and clean it out or if any proximal edema or edema drainage were to occur patient to be seen immediately

## 2018-05-14 DIAGNOSIS — N281 Cyst of kidney, acquired: Secondary | ICD-10-CM | POA: Diagnosis not present

## 2018-05-26 ENCOUNTER — Other Ambulatory Visit: Payer: Self-pay | Admitting: Podiatry

## 2018-06-22 ENCOUNTER — Other Ambulatory Visit: Payer: Self-pay | Admitting: Family Medicine

## 2018-06-22 DIAGNOSIS — Z1231 Encounter for screening mammogram for malignant neoplasm of breast: Secondary | ICD-10-CM

## 2018-07-24 ENCOUNTER — Other Ambulatory Visit: Payer: Self-pay | Admitting: Podiatry

## 2018-08-05 DIAGNOSIS — M2242 Chondromalacia patellae, left knee: Secondary | ICD-10-CM | POA: Diagnosis not present

## 2018-08-05 DIAGNOSIS — M25562 Pain in left knee: Secondary | ICD-10-CM | POA: Diagnosis not present

## 2018-08-13 DIAGNOSIS — M2242 Chondromalacia patellae, left knee: Secondary | ICD-10-CM | POA: Diagnosis not present

## 2018-08-19 DIAGNOSIS — M2242 Chondromalacia patellae, left knee: Secondary | ICD-10-CM | POA: Diagnosis not present

## 2018-08-21 DIAGNOSIS — M25562 Pain in left knee: Secondary | ICD-10-CM | POA: Diagnosis not present

## 2018-08-25 DIAGNOSIS — M25562 Pain in left knee: Secondary | ICD-10-CM | POA: Diagnosis not present

## 2018-08-26 ENCOUNTER — Ambulatory Visit: Payer: Federal, State, Local not specified - PPO

## 2018-08-27 DIAGNOSIS — M25562 Pain in left knee: Secondary | ICD-10-CM | POA: Diagnosis not present

## 2018-08-31 DIAGNOSIS — J45909 Unspecified asthma, uncomplicated: Secondary | ICD-10-CM | POA: Diagnosis not present

## 2018-08-31 DIAGNOSIS — N2889 Other specified disorders of kidney and ureter: Secondary | ICD-10-CM | POA: Diagnosis not present

## 2018-08-31 DIAGNOSIS — E1165 Type 2 diabetes mellitus with hyperglycemia: Secondary | ICD-10-CM | POA: Diagnosis not present

## 2018-08-31 DIAGNOSIS — Z13220 Encounter for screening for lipoid disorders: Secondary | ICD-10-CM | POA: Diagnosis not present

## 2018-08-31 DIAGNOSIS — M25562 Pain in left knee: Secondary | ICD-10-CM | POA: Diagnosis not present

## 2018-09-01 DIAGNOSIS — M25562 Pain in left knee: Secondary | ICD-10-CM | POA: Diagnosis not present

## 2018-09-08 DIAGNOSIS — M25562 Pain in left knee: Secondary | ICD-10-CM | POA: Diagnosis not present

## 2018-09-16 DIAGNOSIS — M2242 Chondromalacia patellae, left knee: Secondary | ICD-10-CM | POA: Diagnosis not present

## 2018-09-16 DIAGNOSIS — M25562 Pain in left knee: Secondary | ICD-10-CM | POA: Diagnosis not present

## 2018-09-24 DIAGNOSIS — Z6836 Body mass index (BMI) 36.0-36.9, adult: Secondary | ICD-10-CM | POA: Diagnosis not present

## 2018-09-24 DIAGNOSIS — E1165 Type 2 diabetes mellitus with hyperglycemia: Secondary | ICD-10-CM | POA: Diagnosis not present

## 2018-10-09 DIAGNOSIS — E1165 Type 2 diabetes mellitus with hyperglycemia: Secondary | ICD-10-CM | POA: Diagnosis not present

## 2018-12-16 DIAGNOSIS — Z20828 Contact with and (suspected) exposure to other viral communicable diseases: Secondary | ICD-10-CM | POA: Diagnosis not present

## 2019-02-12 DIAGNOSIS — N842 Polyp of vagina: Secondary | ICD-10-CM

## 2019-02-12 HISTORY — DX: Polyp of vagina: N84.2

## 2019-02-15 ENCOUNTER — Ambulatory Visit: Payer: Federal, State, Local not specified - PPO | Attending: Internal Medicine

## 2019-02-15 DIAGNOSIS — Z20822 Contact with and (suspected) exposure to covid-19: Secondary | ICD-10-CM

## 2019-02-16 DIAGNOSIS — J069 Acute upper respiratory infection, unspecified: Secondary | ICD-10-CM | POA: Diagnosis not present

## 2019-02-16 LAB — NOVEL CORONAVIRUS, NAA: SARS-CoV-2, NAA: NOT DETECTED

## 2019-02-17 ENCOUNTER — Telehealth: Payer: Self-pay | Admitting: Hematology

## 2019-02-17 NOTE — Telephone Encounter (Signed)
Pt is aware covid 19 test is neg on 02/17/2019 

## 2019-02-22 DIAGNOSIS — K080 Exfoliation of teeth due to systemic causes: Secondary | ICD-10-CM | POA: Diagnosis not present

## 2019-04-11 DIAGNOSIS — L089 Local infection of the skin and subcutaneous tissue, unspecified: Secondary | ICD-10-CM | POA: Diagnosis not present

## 2019-04-11 DIAGNOSIS — L608 Other nail disorders: Secondary | ICD-10-CM | POA: Diagnosis not present

## 2019-05-05 ENCOUNTER — Other Ambulatory Visit: Payer: Self-pay

## 2019-05-05 ENCOUNTER — Encounter: Payer: Self-pay | Admitting: Podiatry

## 2019-05-05 ENCOUNTER — Ambulatory Visit: Payer: Federal, State, Local not specified - PPO | Admitting: Podiatry

## 2019-05-05 VITALS — Temp 98.2°F

## 2019-05-05 DIAGNOSIS — L600 Ingrowing nail: Secondary | ICD-10-CM

## 2019-05-05 MED ORDER — NEOMYCIN-POLYMYXIN-HC 3.5-10000-1 OT SOLN: OTIC | 1 refills | Status: DC

## 2019-05-05 NOTE — Patient Instructions (Signed)

## 2019-05-05 NOTE — Progress Notes (Signed)
Subjective:   Patient ID: Sheri Lara, female   DOB: 54 y.o.   MRN: [id number]   HPI Patient presents with chronic ingrown toenail left big toe medial border state its been very tender and has been on antibiotics but this is been ongoing and have the right one fix and would like this when corrected   ROS      Objective:  Physical Exam  Neurovascular status intact with incurvated medial border left hallux painful when pressed with no active drainage noted or acute redness.  Right hallux that is been corrected     Assessment:  Chronic ingrown toenail deformity left hallux medial border     Plan:  H&P reviewed condition recommended correction explained procedure risk and patient wants procedure signed consent form.  I infiltrated the left hallux 60 mg like Marcaine mixture sterile prep applied to the toe and using sterile instrumentation remove the medial border exposed matrix and applied phenol 3 applications 30 seconds followed by alcohol lavage sterile dressing and gave instructions for soaks and to leave dressing on 24 hours but take it off earlier if needed and today did go ahead and wrote prescription for drops for the postop.  And encouraged her to call with questions

## 2019-05-14 ENCOUNTER — Ambulatory Visit
Admission: RE | Admit: 2019-05-14 | Discharge: 2019-05-14 | Disposition: A | Discharge status: Home / Self Care | Payer: Federal, State, Local not specified - PPO | Source: Ambulatory Visit | Attending: Family Medicine | Admitting: Family Medicine

## 2019-05-14 ENCOUNTER — Other Ambulatory Visit: Payer: Self-pay

## 2019-05-14 DIAGNOSIS — Z1231 Encounter for screening mammogram for malignant neoplasm of breast: Secondary | ICD-10-CM

## 2019-05-19 DIAGNOSIS — E1165 Type 2 diabetes mellitus with hyperglycemia: Secondary | ICD-10-CM | POA: Diagnosis not present

## 2019-05-19 DIAGNOSIS — L84 Corns and callosities: Secondary | ICD-10-CM | POA: Diagnosis not present

## 2019-07-05 ENCOUNTER — Ambulatory Visit: Payer: Federal, State, Local not specified - PPO | Admitting: Internal Medicine

## 2019-07-05 ENCOUNTER — Other Ambulatory Visit: Payer: Self-pay

## 2019-07-05 ENCOUNTER — Encounter: Payer: Self-pay | Admitting: Internal Medicine

## 2019-07-05 VITALS — BP 114/64 | HR 70 | Temp 97.8°F | Ht 64.0 in | Wt 216.4 lb

## 2019-07-05 DIAGNOSIS — E119 Type 2 diabetes mellitus without complications: Secondary | ICD-10-CM

## 2019-07-05 DIAGNOSIS — E1165 Type 2 diabetes mellitus with hyperglycemia: Secondary | ICD-10-CM | POA: Insufficient documentation

## 2019-07-05 DIAGNOSIS — E785 Hyperlipidemia, unspecified: Secondary | ICD-10-CM

## 2019-07-05 LAB — GLUCOSE, POCT (MANUAL RESULT ENTRY): POC Glucose: 312 mg/dl — AB (ref 70–99)

## 2019-07-05 MED ORDER — GLIPIZIDE 5 MG PO TABS: 5.0000 mg | ORAL_TABLET | Freq: Every day | ORAL | 6 refills | Status: DC

## 2019-07-05 NOTE — Progress Notes (Signed)
Name: Sheri Lara  MRN/ DOB: [id number], 1965/09/13   Age/ Sex: 54 y.o., female    PCP: Lujean Amel, MD   Reason for Endocrinology Evaluation: Type 2 Diabetes Mellitus     Date of Initial Endocrinology Visit: 07/05/2019     PATIENT IDENTIFIER: [name] is a 54 y.o. female with a past medical history of T2DM and Obesity . The patient presented for initial endocrinology clinic visit on 07/05/2019 for consultative assistance with her diabetes management.    HPI: [name] was    Diagnosed with DM in 2016 Prior Medications tried/Intolerance: Jardiance- diarrhea, vaginal swelling  Currently checking blood sugars 1 x / day Hypoglycemia episodes : no         Hemoglobin A1c has ranged from 7.3% in 2019, peaking at 10.0% in 2020. Patient required assistance for hypoglycemia: no  Patient has required hospitalization within the last 1 year from hyper or hypoglycemia: no   In terms of diet, the patient snacks through the morning . Avoids sugar-sweetened beverages.   She had explosive bowel movement with 4 tabs daily   Eats cookies this am   HOME DIABETES REGIMEN: Metformin 500 mg ER takes 3 tablets daily  Trulicity 1.5 mg weekly (Sunday)    Statin: No  ACE-I/ARB:No  Prior Diabetic Education: no    METER DOWNLOAD SUMMARY: Did not bring    DIABETIC COMPLICATIONS: Microvascular complications:   Denies: retinopathy , CKD, neuropathy   Last eye exam: Completed 03/2019  Macrovascular complications:    Denies: CAD, PVD, CVA   PAST HISTORY: Past Medical History:  Past Medical History:  Diagnosis Date  . Asthma   . Diabetes mellitus without complication [facility])    Past Surgical History:  Past Surgical History:  Procedure Laterality Date  . DENTAL SURGERY    . TONSILLECTOMY        Social History:  reports that she has never smoked. She has never used smokeless tobacco. She reports current alcohol use. She reports that she does not use  drugs.  Family History: No family history on file.   HOME MEDICATIONS: Allergies as of 07/05/2019      Reactions   Sulfa Antibiotics Hives, Rash      Medication List       Accurate as of Jul 05, 2019 11:13 AM. If you have any questions, ask your nurse or doctor.        diclofenac 75 MG EC tablet Commonly known as: VOLTAREN TAKE 1 TABLET BY MOUTH TWICE A DAY   doxycycline 100 MG capsule Commonly known as: VIBRAMYCIN Take 100 mg by mouth 2 (two) times daily.   metFORMIN 1000 MG tablet Commonly known as: GLUCOPHAGE metformin 1,000 mg tablet   1000 mg every day by oral route.   metFORMIN 500 MG 24 hr tablet Commonly known as: GLUCOPHAGE-XR Take 2,000 mg by mouth at bedtime. 1500mg    neomycin-polymyxin-hydrocortisone OTIC solution Commonly known as: CORTISPORIN Apply 1-2 drops to toe after soaking BID   omeprazole 10 MG capsule Commonly known as: PRILOSEC Take 10 mg by mouth daily as needed.   Symbicort 160-4.5 MCG/ACT inhaler Generic drug: budesonide-formoterol Symbicort 160 mcg-4.5 mcg/actuation HFA aerosol inhaler  TAKE 2 PUFFS BY MOUTH TWICE A DAY   Symbicort 160-4.5 MCG/ACT inhaler Generic drug: budesonide-formoterol TAKE 2 PUFFS BY MOUTH TWICE A DAY   Trulicity 1.5 0000000 Sopn Generic drug: Dulaglutide SMARTSIG:0.5 Milliliter(s) SUB-Q Once a Week What changed: Another medication with the same name was removed. Continue taking  this medication, and follow the directions you see here. Changed by: Dorita Sciara, MD        ALLERGIES: Allergies  Allergen Reactions  . Sulfa Antibiotics Hives and Rash     REVIEW OF SYSTEMS: A comprehensive ROS was conducted with the patient and is negative except as per HPI and below:  ROS    OBJECTIVE:   VITAL SIGNS: BP 114/64 (BP Location: Left Arm, Patient Position: Sitting, Cuff Size: Large)   Pulse 70   Temp 97.8 F (36.6 C)   Ht 5\' 4"  (1.626 m)   Wt 216 lb 6.4 oz (98.2 kg)   SpO2 97%   BMI  37.14 kg/m    PHYSICAL EXAM:  General: Pt appears well and is in NAD  HEENT:  Eyes: External eye exam normal without stare, lid lag or exophthalmos.  EOM intact.  PERRL.  Neck: General: Supple without adenopathy or carotid bruits. Thyroid: Thyroid size normal.  No goiter or nodules appreciated. No thyroid bruit.  Lungs: Clear with good BS bilat with no rales, rhonchi, or wheezes  Heart: RRR with normal S1 and S2 and no gallops; no murmurs; no rub  Abdomen: Normoactive bowel sounds, soft, nontender, without masses or organomegaly palpable  Extremities:  Lower extremities - No pretibial edema. No lesions.  Skin: Normal texture and temperature to palpation.   Neuro: MS is good with appropriate affect, pt is alert and Ox3    DM foot exam: 07/05/2019  The skin of the feet is intact without sores or ulcerations. The pedal pulses are 2+ on right and 2+ on left. The sensation is intact to a screening 5.07, 10 gram monofilament bilaterally   DATA REVIEWED: 05/19/2019 A1c 9.2% BUN/CR 13/0.85  GFR 69 TG 132 HDL 58 LDL 105   In-Office BG 312 mg daily  ASSESSMENT / PLAN / RECOMMENDATIONS:   1) Type 2 Diabetes Mellitus, Poorly controlled, Without complications - Most recent A1c of 9.2 %. Goal A1c < 7.0%.   Plan: GENERAL:  Pt admits to dietary indiscretions I have discussed with the patient the pathophysiology of diabetes. We went over the natural progression of the disease. We talked about both insulin resistance and insulin deficiency. We stressed the importance of lifestyle changes including diet and exercise. I explained the complications associated with diabetes including retinopathy, nephropathy, neuropathy as well as increased risk of cardiovascular disease. We went over the benefit seen with glycemic control.    I explained to the patient that diabetic patients are at higher than normal risk for amputations.   We discussed the importance of avoiding sugar-sweetened beverages  and avoiding snacks, we discussed low carb options for snacks  Her in-office Bg today was 312 mg/dL but she had cookies this morning.   We discussed adding a SU , we discussed risk of rash as she is allergic to bactrim with rash. Pt advised  to stop Glipizide and notify us should she have an allergic reaction     MEDICATIONS: - Continue Metformin 500 mg , 3 tablets daily  - Continue Trulicity 1.5 mg weekly  - Start Glipizide 5 mg, 1 tablet before Breakfast    EDUCATION / INSTRUCTIONS:  BG monitoring instructions: Patient is instructed to check her blood sugars 2 times a day, *befor breakfast and Supper   [facility] if: BG persistently < 70 or > 300. . I reviewed the Rule of 15 for the treatment of hypoglycemia in detail with the patient. Literature supplied.   2)  Diabetic complications:   Eye: Does not have known diabetic retinopathy.   Neuro/ Feet: Does not have known diabetic peripheral neuropathy.  Renal: Patient does not have known baseline CKD. She is not on an ACEI/ARB at present.  3) Dyslipidemia: Patient states a statin therapy was recommended by her PCP in the past and that she does not like to take meds . We discussed the cardiovascular benefits of statins,and  ADA recommendation. I have strongly urged her to consider this. LDL above goal at 105 mg/dL.     F/U in 3 months      Signed electronically by: Mack Guise, MD  [facility]  [facility] [address] [place], [place] [address] Phone: [phone] FAX: [phone]   CC: Lujean Amel, [address] [place] [address] Phone: [phone]  Fax: [phone]    Return to Endocrinology clinic as below: No future appointments.

## 2019-07-05 NOTE — Patient Instructions (Addendum)
-   Continue Metformin 500 mg , 3 tablets daily  - Continue Trulicity 1.5 mg weekly  - Start Glipizide 5 mg, 1 tablet before Breakfast    Choose healthy, lower carb lower calorie snacks: toss salad, vegetables, cottage cheese, peanut butter, low fat cheese / string cheese, lower sodium deli meat, tuna salad or chicken salad      HOW TO TREAT LOW BLOOD SUGARS (Blood sugar LESS THAN 70 MG/DL)  Please follow the RULE OF 15 for the treatment of hypoglycemia treatment (when your (blood sugars are less than 70 mg/dL)    STEP 1: Take 15 grams of carbohydrates when your blood sugar is low, which includes:   3-4 GLUCOSE TABS  OR  3-4 OZ OF JUICE OR REGULAR SODA OR  ONE TUBE OF GLUCOSE GEL     STEP 2: RECHECK blood sugar in 15 MINUTES STEP 3: If your blood sugar is still low at the 15 minute recheck --> then, go back to STEP 1 and treat AGAIN with another 15 grams of carbohydrates.

## 2019-08-12 DIAGNOSIS — R1013 Epigastric pain: Secondary | ICD-10-CM | POA: Diagnosis not present

## 2019-08-12 DIAGNOSIS — K219 Gastro-esophageal reflux disease without esophagitis: Secondary | ICD-10-CM | POA: Diagnosis not present

## 2019-08-12 DIAGNOSIS — K148 Other diseases of tongue: Secondary | ICD-10-CM | POA: Diagnosis not present

## 2019-08-13 ENCOUNTER — Other Ambulatory Visit: Payer: Self-pay | Admitting: Family Medicine

## 2019-08-13 DIAGNOSIS — R1013 Epigastric pain: Secondary | ICD-10-CM

## 2019-08-17 DIAGNOSIS — K219 Gastro-esophageal reflux disease without esophagitis: Secondary | ICD-10-CM | POA: Diagnosis not present

## 2019-08-17 DIAGNOSIS — R110 Nausea: Secondary | ICD-10-CM | POA: Diagnosis not present

## 2019-08-19 ENCOUNTER — Ambulatory Visit
Admission: RE | Admit: 2019-08-19 | Discharge: 2019-08-19 | Disposition: A | Discharge status: Home / Self Care | Payer: Federal, State, Local not specified - PPO | Source: Ambulatory Visit | Attending: Family Medicine | Admitting: Family Medicine

## 2019-08-19 DIAGNOSIS — R1013 Epigastric pain: Secondary | ICD-10-CM

## 2019-08-19 DIAGNOSIS — K224 Dyskinesia of esophagus: Secondary | ICD-10-CM | POA: Diagnosis not present

## 2019-09-21 DIAGNOSIS — Z0000 Encounter for general adult medical examination without abnormal findings: Secondary | ICD-10-CM | POA: Diagnosis not present

## 2019-09-30 DIAGNOSIS — Z13220 Encounter for screening for lipoid disorders: Secondary | ICD-10-CM | POA: Diagnosis not present

## 2019-09-30 DIAGNOSIS — Z0000 Encounter for general adult medical examination without abnormal findings: Secondary | ICD-10-CM | POA: Diagnosis not present

## 2019-10-07 ENCOUNTER — Ambulatory Visit: Payer: Federal, State, Local not specified - PPO | Admitting: Internal Medicine

## 2019-11-04 ENCOUNTER — Other Ambulatory Visit: Payer: Self-pay

## 2019-11-04 ENCOUNTER — Other Ambulatory Visit (HOSPITAL_COMMUNITY)
Admission: RE | Admit: 2019-11-04 | Discharge: 2019-11-04 | Disposition: A | Discharge status: Home / Self Care | Payer: Federal, State, Local not specified - PPO | Source: Ambulatory Visit | Attending: Obstetrics and Gynecology | Admitting: Obstetrics and Gynecology

## 2019-11-04 ENCOUNTER — Ambulatory Visit: Payer: Federal, State, Local not specified - PPO | Admitting: Obstetrics and Gynecology

## 2019-11-04 ENCOUNTER — Encounter: Payer: Self-pay | Admitting: Obstetrics and Gynecology

## 2019-11-04 VITALS — BP 120/70 | HR 76 | Resp 22 | Ht 63.5 in | Wt 219.0 lb

## 2019-11-04 DIAGNOSIS — N842 Polyp of vagina: Secondary | ICD-10-CM | POA: Diagnosis not present

## 2019-11-04 DIAGNOSIS — Z87442 Personal history of urinary calculi: Secondary | ICD-10-CM

## 2019-11-04 DIAGNOSIS — Z01419 Encounter for gynecological examination (general) (routine) without abnormal findings: Secondary | ICD-10-CM | POA: Insufficient documentation

## 2019-11-04 DIAGNOSIS — R140 Abdominal distension (gaseous): Secondary | ICD-10-CM

## 2019-11-04 LAB — POCT URINALYSIS DIPSTICK
Bilirubin, UA: NEGATIVE
Blood, UA: 1
Glucose, UA: NEGATIVE
Ketones, UA: NEGATIVE
Leukocytes, UA: NEGATIVE
Nitrite, UA: NEGATIVE
Protein, UA: NEGATIVE
Urobilinogen, UA: 0.2 E.U./dL
pH, UA: 5 (ref 5.0–8.0)

## 2019-11-04 NOTE — Patient Instructions (Signed)

## 2019-11-04 NOTE — Progress Notes (Signed)
54 y.o. G0P0000 Unknown [place] female here for new patient annual exam.    States she abdominal has gotten bigger but she has not gained weight.  She is worried about ovarian cancer.  Denies vaginal bleeding. Denies pelvic pain.  Feels bloating.   Has a rash under her stomach.   She takes medication for diabetes.   Has hemorrhoids.  Had colonoscopy and she will see her GI on October 8 for epigastric pain.  She is not sure if she has blood in her stool.  Having some pain in her back and questions if she has kidney stones.   Has been vaccinated against Covid.   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   PCP: [facility]   Endocrinology:  Dr. Kelton Pillar.  Patient's last menstrual period was 02/11/2009 (approximate).           Sexually active: No.  The current method of family planning is post menopausal status.    Exercising: No.  The patient does not participate in regular exercise at present. Smoker:  no  Health Maintenance: Pap: 3 years normal.   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    reports that she has never smoked. She has never used smokeless tobacco. She reports current alcohol use. She reports that she does not use drugs.  Past Medical History:  Diagnosis Date  . Asthma   . Diabetes mellitus without complication ([place])   . Fibroid   . History of kidney stones     Past Surgical History:  Procedure Laterality Date  . DENTAL SURGERY    . LITHOTRIPSY  2016   [place]  . TONSILLECTOMY      Current Outpatient Medications  Medication Sig Dispense Refill  . budesonide-formoterol (SYMBICORT) 160-4.5 MCG/ACT inhaler Symbicort 160 mcg-4.5 mcg/actuation HFA aerosol inhaler  TAKE 2  PUFFS BY MOUTH TWICE A DAY    . diclofenac (VOLTAREN) 75 MG EC tablet TAKE 1 TABLET BY MOUTH TWICE A DAY 60 tablet 1  . glipiZIDE (GLUCOTROL) 5 MG tablet Take 1 tablet (5 mg total) by mouth daily before breakfast. 30 tablet 6  . metFORMIN (GLUCOPHAGE-XR) 500 MG 24 hr tablet Take 2,000 mg by mouth at bedtime. 1500mg     . omeprazole (PRILOSEC) 40 MG capsule Take 40 mg by mouth every morning.    . TRULICITY 1.5 FY/1.0FB SOPN SMARTSIG:0.5 Milliliter(s) SUB-Q Once a Week    . atorvastatin (LIPITOR) 10 MG tablet Take 10 mg by mouth daily. (Patient not taking: Reported on 11/04/2019)     No current facility-administered medications for this visit.    Family History  Problem Relation Age of Onset  . Cancer Mother        ?metastatic lung ca  . Diabetes Mother   . Cancer Father        melanoma  . Heart disease Father     Review of Systems  All other systems reviewed and are negative.   Exam:   BP 120/70 (Cuff Size: Large)   Pulse 76   Resp (!) 22   Ht 5' 3.5" (1.613 m)   Wt 219 lb (99.3 kg)   LMP 02/11/2009 (Approximate)   BMI 38.19 kg/m     General appearance:  alert, cooperative and appears stated age Head: normocephalic, without obvious abnormality, atraumatic Neck: no adenopathy, supple, symmetrical, trachea midline and thyroid normal to inspection and palpation Lungs: clear to auscultation bilaterally Breasts: normal appearance, no masses or tenderness, No nipple retraction or dimpling, No nipple discharge or bleeding, No axillary adenopathy Heart: regular rate and rhythm Abdomen: soft, non-tender; no masses, no organomegaly Extremities: extremities normal, atraumatic, no cyanosis or edema Skin: skin color, texture, turgor normal. No rashes or lesions Lymph nodes: cervical, supraclavicular, and axillary nodes normal. Neurologic: grossly normal  Pelvic: External genitalia:  no lesions              No abnormal inguinal nodes palpated.              Urethra:  normal appearing  urethra with no masses, tenderness or lesions              Bartholins and Skenes: normal                 Vagina: normal appearing vagina with normal color and discharge, fleshy pink right vaginal polyp.              Cervix: no lesions              Pap taken: Yes.   Bimanual Exam:  Uterus:  normal size, contour, position, consistency, mobility, non-tender              Adnexa: no mass, fullness, tenderness              Rectal exam: Yes.  .  Confirms.              Anus:  normal sphincter tone, no lesions  Chaperone was present for exam.  Assessment:   Well woman visit with normal exam. Bloating. Hx fibroid. Vaginal polyp. History of kidney stones.   Plan: Mammogram screening discussed. Self breast awareness reviewed. Pap and HR HPV as above. Guidelines for Calcium, Vitamin D, regular exercise program including cardiovascular and weight bearing exercise. Return for pelvic US.  Return for vaginal polyp removal.  This will be on a separate day from her pelvic ultrasound appointment.  Urine dip and will send for micro and cx if positive.  Follow up annually and prn.   After visit summary provided.

## 2019-11-05 ENCOUNTER — Telehealth: Payer: Self-pay

## 2019-11-05 LAB — CYTOLOGY - PAP
Adequacy: ABSENT
Comment: NEGATIVE
Diagnosis: NEGATIVE
High risk HPV: NEGATIVE

## 2019-11-05 LAB — URINALYSIS, MICROSCOPIC ONLY
Bacteria, UA: NONE SEEN
Casts: NONE SEEN /lpf
RBC, Urine: NONE SEEN /hpf (ref 0–2)
WBC, UA: NONE SEEN /hpf (ref 0–5)

## 2019-11-05 NOTE — Telephone Encounter (Signed)
Call to patient. Per DPR, OK to leave message on voicemail.   Left voicemail requesting a return call to Sheri Lara to review benefits and schedule recommended Pelvic ultrasound with Brook A. Silva, MD, FACOG 

## 2019-11-06 LAB — URINE CULTURE

## 2019-11-08 NOTE — Telephone Encounter (Signed)
Call to patient. Per DPR, OK to leave message on voicemail.   Left voicemail requesting a return call to Delaina Fetsch to review benefits and schedule recommended Pelvic ultrasound with Brook A. Silva, MD, FACOG 

## 2019-11-11 NOTE — Telephone Encounter (Signed)
Spoke with patient regarding benefits for recommended ultrasound and vaginal biopsy. Patient is aware that ultrasound is transvaginal. Patient acknowledges understanding of information presented. Patient is aware of cancellation policy. Patient scheduled ultrasound appointment for 11/25/2019 at 1000AM with [place]. Quincy Simmonds, MD, Cherlynn June.   Patient declines to scheduled vaginal biopsy at this time. Patient is wanting to speak with Dr. Quincy Simmonds more regarding the biopsy when she comes in for the PUS appointment.   Routing to Dr. Quincy Simmonds and [place] for [place].  Encounter closed.

## 2019-11-16 ENCOUNTER — Telehealth: Payer: Self-pay

## 2019-11-16 NOTE — Telephone Encounter (Signed)
Patient is calling in regards to wanting " a skin tag removed".

## 2019-11-16 NOTE — Telephone Encounter (Signed)
Spoke with patient. Patient request to proceed with scheduling removal of vaginal polyp. Patient is requesting appt this week or next week before her PUS scheduled on 10/14. States she will be off next week. Advised no available appt prior to PUS. Advised can look at other options to meet scheduling needs. Patient states she will have to wait until next year due to her work schedule. Patient declined to look at other dates. Patient ended call.    Call returned to patient on mobile number, left detailed message, ok per dpr.  Offered appt for 10/15 at 11:30am with Dr. Quincy Simmonds, return call to office to schedule.

## 2019-11-16 NOTE — Telephone Encounter (Signed)
Spoke with patient.  Advised appt scheduled for 11/26/19 at 11:30am with Dr. Quincy Simmonds. Patient  Agreeable to date and time, thankful for return call.   Routing to [employer] and [employer] .   Encounter closed.

## 2019-11-16 NOTE — Telephone Encounter (Signed)
Patient returned call. Friday 10/15 at 11:30 is good for appointment.

## 2019-11-19 ENCOUNTER — Ambulatory Visit: Payer: Federal, State, Local not specified - PPO | Admitting: Internal Medicine

## 2019-11-19 DIAGNOSIS — Z23 Encounter for immunization: Secondary | ICD-10-CM | POA: Diagnosis not present

## 2019-11-25 ENCOUNTER — Encounter: Payer: Self-pay | Admitting: Obstetrics and Gynecology

## 2019-11-25 ENCOUNTER — Other Ambulatory Visit: Payer: Self-pay

## 2019-11-25 ENCOUNTER — Ambulatory Visit: Payer: Federal, State, Local not specified - PPO | Admitting: Obstetrics and Gynecology

## 2019-11-25 ENCOUNTER — Other Ambulatory Visit: Payer: Self-pay | Admitting: Obstetrics and Gynecology

## 2019-11-25 ENCOUNTER — Ambulatory Visit (INDEPENDENT_AMBULATORY_CARE_PROVIDER_SITE_OTHER): Payer: Federal, State, Local not specified - PPO

## 2019-11-25 VITALS — BP 102/64 | HR 72 | Ht 63.5 in | Wt 219.0 lb

## 2019-11-25 DIAGNOSIS — N859 Noninflammatory disorder of uterus, unspecified: Secondary | ICD-10-CM

## 2019-11-25 DIAGNOSIS — R140 Abdominal distension (gaseous): Secondary | ICD-10-CM | POA: Diagnosis not present

## 2019-11-25 DIAGNOSIS — D219 Benign neoplasm of connective and other soft tissue, unspecified: Secondary | ICD-10-CM | POA: Diagnosis not present

## 2019-11-25 NOTE — Progress Notes (Signed)
GYNECOLOGY  VISIT   HPI: 54 y.o.   Unknown  American Indian/Caucasian  female   [place] with Patient's last menstrual period was 02/11/2009 (approximate).   here for pelvic ultrasound.   She has abdominal bloating symptoms.  She has a history of uterine fibroid formation.   She also have a vaginal polypoid lesion and has an appointment scheduled for tomorrow for biopsy/removal.  Denies vaginal bleeding.   Has some urinary incontinence and strains to empty her bladder.   GYNECOLOGIC HISTORY: Patient's last menstrual period was 02/11/2009 (approximate). Contraception: PMP Menopausal hormone therapy:  none Last mammogram:  05-14-19  3D/Neg/density B/BiRads1 Last pap smear:  11-04-19 pap  Neg:Neg HR HPV, 2018 normal per patient         OB History    Gravida  0   Para  0   Term  0   Preterm  0   AB  0   Living  0     SAB  0   TAB  0   Ectopic  0   Multiple  0   Live Births  0              Patient Active Problem List   Diagnosis Date Noted  . Type 2 diabetes mellitus with hyperglycemia, without long-term current use of insulin ([place]) 07/05/2019  . Dyslipidemia 07/05/2019  . Atypical chest pain 11/15/2016  . Family history of heart disease 11/15/2016  . Type 2 diabetes mellitus without complication ([place]) 08/07/9483  . Vitamin D deficiency 04/07/2014  . Calculus of kidney 03/11/2014    Past Medical History:  Diagnosis Date  . Asthma   . Diabetes mellitus without complication ([place])   . Fibroid   . History of kidney stones     Past Surgical History:  Procedure Laterality Date  . DENTAL SURGERY    . LITHOTRIPSY  2016   [place]  . TONSILLECTOMY      Current Outpatient Medications  Medication Sig Dispense Refill  . atorvastatin (LIPITOR) 10 MG tablet Take 10 mg by mouth daily.     . budesonide-formoterol (SYMBICORT) 160-4.5 MCG/ACT inhaler Symbicort 160 mcg-4.5 mcg/actuation HFA aerosol inhaler  TAKE 2 PUFFS BY MOUTH TWICE A DAY    . diclofenac  (VOLTAREN) 75 MG EC tablet TAKE 1 TABLET BY MOUTH TWICE A DAY 60 tablet 1  . glipiZIDE (GLUCOTROL) 5 MG tablet Take 1 tablet (5 mg total) by mouth daily before breakfast. 30 tablet 6  . metFORMIN (GLUCOPHAGE-XR) 500 MG 24 hr tablet Take 2,000 mg by mouth at bedtime. 1500mg     . omeprazole (PRILOSEC) 40 MG capsule Take 40 mg by mouth every morning.    . TRULICITY 1.5 IO/2.7OJ SOPN SMARTSIG:0.5 Milliliter(s) SUB-Q Once a Week     No current facility-administered medications for this visit.     ALLERGIES: Sulfa antibiotics  Family History  Problem Relation Age of Onset  . Cancer Mother        ?metastatic lung ca  . Diabetes Mother   . Cancer Father        melanoma  . Heart disease Father     Social History   Socioeconomic History  . Marital status: Unknown    Spouse name: Not on file  . Number of children: Not on file  . Years of education: Not on file  . Highest education level: Not on file  Occupational History  . Not on file  Tobacco Use  . Smoking status: Never Smoker  . Smokeless  tobacco: Never Used  Vaping Use  . Vaping Use: Never used  Substance and Sexual Activity  . Alcohol use: Yes    Comment: 1 drink/month  . Drug use: No  . Sexual activity: Not Currently    Birth control/protection: Post-menopausal  Other Topics Concern  . Not on file  Social History Narrative  . Not on file   Social Determinants of Health   Financial Resource Strain:   . Difficulty of Paying Living Expenses: Not on file  Food Insecurity:   . Worried About Charity fundraiser in the Last Year: Not on file  . Ran Out of Food in the Last Year: Not on file  Transportation Needs:   . Lack of Transportation (Medical): Not on file  . Lack of Transportation (Non-Medical): Not on file  Physical Activity:   . Days of Exercise per Week: Not on file  . Minutes of Exercise per Session: Not on file  Stress:   . Feeling of Stress : Not on file  Social Connections:   . Frequency of  Communication with Friends and Family: Not on file  . Frequency of Social Gatherings with Friends and Family: Not on file  . Attends Religious Services: Not on file  . Active Member of Clubs or Organizations: Not on file  . Attends Archivist Meetings: Not on file  . Marital Status: Not on file  Intimate Partner Violence:   . Fear of Current or Ex-Partner: Not on file  . Emotionally Abused: Not on file  . Physically Abused: Not on file  . Sexually Abused: Not on file    Review of Systems  All other systems reviewed and are negative.   PHYSICAL EXAMINATION:    BP 102/64 (Cuff Size: Large)   Pulse 72   Ht 5' 3.5" (1.613 m)   Wt 219 lb (99.3 kg)   LMP 02/11/2009 (Approximate)   SpO2 98%   BMI 38.19 kg/m     General appearance: alert, cooperative and appears stated age   Pelvic US Uterus with fibroids - 1.1, 2.7, 4.2 cm EMS 5.42 mm with fluid in endometrial canal.  Right ovary 2.4 cm simple cyst.  Left ovary not seen.  No free fluid.   ASSESSMENT  Abdominal bloating.  Doubt gynecologic origin.  Fluid in endometrial canal.  Simple right ovarian cyst.  Fibroids.  Vaginal polyp, lesion.   PLAN  We discussed fibroids, her right ovarian cyst, and the implication of fluid in the endometrial canal with a measurement over 3 mm.  She will return tomorrow for a vaginal biopsy and will also do endometrial biopsy at the same time.  Procedure and rationale for endometrial biopsy to rule out abnormal cells discussed.    An After Visit Summary was printed and given to the patient.  ______ minutes face to face time of which over 50% was spent in counseling.

## 2019-11-26 ENCOUNTER — Encounter: Payer: Self-pay | Admitting: Obstetrics and Gynecology

## 2019-11-26 ENCOUNTER — Ambulatory Visit: Payer: Federal, State, Local not specified - PPO | Admitting: Obstetrics and Gynecology

## 2019-11-26 ENCOUNTER — Other Ambulatory Visit (HOSPITAL_COMMUNITY)
Admission: RE | Admit: 2019-11-26 | Discharge: 2019-11-26 | Disposition: A | Discharge status: Home / Self Care | Payer: Federal, State, Local not specified - PPO | Source: Ambulatory Visit | Attending: Obstetrics and Gynecology | Admitting: Obstetrics and Gynecology

## 2019-11-26 VITALS — BP 116/68 | HR 76 | Resp 14 | Ht 63.5 in | Wt 219.0 lb

## 2019-11-26 DIAGNOSIS — N858 Other specified noninflammatory disorders of uterus: Secondary | ICD-10-CM | POA: Diagnosis not present

## 2019-11-26 DIAGNOSIS — N842 Polyp of vagina: Secondary | ICD-10-CM | POA: Diagnosis not present

## 2019-11-26 DIAGNOSIS — N859 Noninflammatory disorder of uterus, unspecified: Secondary | ICD-10-CM | POA: Insufficient documentation

## 2019-11-26 DIAGNOSIS — R9389 Abnormal findings on diagnostic imaging of other specified body structures: Secondary | ICD-10-CM

## 2019-11-26 NOTE — Progress Notes (Signed)
GYNECOLOGY  VISIT   HPI: 54 y.o.   Unknown  Caucasian  female   G0P0000 with Patient's last menstrual period was 02/11/2009 (approximate).   here for vaginal biopsy and EMB     Patient had [place] for bloating and fluid was noted in the endometrial canal with endometrium measuring 5.42 mm. Her right ovary contained a 2.4 cm simple cyst, and the left ovary was not seen.   She has a vaginal polypoid lesion of the right mid-upper vagina.   GYNECOLOGIC HISTORY: Patient's last menstrual period was 02/11/2009 (approximate). Contraception:  Postmenopausal Menopausal hormone therapy:  none Last mammogram:  05/14/19 BIRADS 1 negative/density b Last pap smear:   11/04/19 Neg:Neg HR HPV    2018 Normal per patient        OB History    Gravida  0   Para  0   Term  0   Preterm  0   AB  0   Living  0     SAB  0   TAB  0   Ectopic  0   Multiple  0   Live Births  0              Patient Active Problem List   Diagnosis Date Noted  . Type 2 diabetes mellitus with hyperglycemia, without long-term current use of insulin ([place]) 07/05/2019  . Dyslipidemia 07/05/2019  . Atypical chest pain 11/15/2016  . Family history of heart disease 11/15/2016  . Type 2 diabetes mellitus without complication ([place]) 93/73/4287  . Vitamin D deficiency 04/07/2014  . Calculus of kidney 03/11/2014    Past Medical History:  Diagnosis Date  . Asthma   . Diabetes mellitus without complication ([place])   . Fibroid   . History of kidney stones     Past Surgical History:  Procedure Laterality Date  . DENTAL SURGERY    . LITHOTRIPSY  2016   [place]  . TONSILLECTOMY      Current Outpatient Medications  Medication Sig Dispense Refill  . atorvastatin (LIPITOR) 10 MG tablet Take 10 mg by mouth daily.     . budesonide-formoterol (SYMBICORT) 160-4.5 MCG/ACT inhaler Symbicort 160 mcg-4.5 mcg/actuation HFA aerosol inhaler  TAKE 2 PUFFS BY MOUTH TWICE A DAY    . diclofenac (VOLTAREN) 75 MG EC tablet TAKE 1  TABLET BY MOUTH TWICE A DAY 60 tablet 1  . glipiZIDE (GLUCOTROL) 5 MG tablet Take 1 tablet (5 mg total) by mouth daily before breakfast. 30 tablet 6  . metFORMIN (GLUCOPHAGE-XR) 500 MG 24 hr tablet Take 2,000 mg by mouth at bedtime. 1500mg     . omeprazole (PRILOSEC) 40 MG capsule Take 40 mg by mouth every morning.    . TRULICITY 1.5 GO/1.1XB SOPN SMARTSIG:0.5 Milliliter(s) SUB-Q Once a Week     No current facility-administered medications for this visit.     ALLERGIES: Sulfa antibiotics  Family History  Problem Relation Age of Onset  . Cancer Mother        ?metastatic lung ca  . Diabetes Mother   . Cancer Father        melanoma  . Heart disease Father     Social History   Socioeconomic History  . Marital status: Unknown    Spouse name: Not on file  . Number of children: Not on file  . Years of education: Not on file  . Highest education level: Not on file  Occupational History  . Not on file  Tobacco Use  . Smoking status:  Never Smoker  . Smokeless tobacco: Never Used  Vaping Use  . Vaping Use: Never used  Substance and Sexual Activity  . Alcohol use: Yes    Comment: 1 drink/month  . Drug use: No  . Sexual activity: Not Currently    Birth control/protection: Post-menopausal  Other Topics Concern  . Not on file  Social History Narrative  . Not on file   Social Determinants of Health   Financial Resource Strain:   . Difficulty of Paying Living Expenses: Not on file  Food Insecurity:   . Worried About Charity fundraiser in the Last Year: Not on file  . Ran Out of Food in the Last Year: Not on file  Transportation Needs:   . Lack of Transportation (Medical): Not on file  . Lack of Transportation (Non-Medical): Not on file  Physical Activity:   . Days of Exercise per Week: Not on file  . Minutes of Exercise per Session: Not on file  Stress:   . Feeling of Stress : Not on file  Social Connections:   . Frequency of Communication with Friends and Family: Not on  file  . Frequency of Social Gatherings with Friends and Family: Not on file  . Attends Religious Services: Not on file  . Active Member of Clubs or Organizations: Not on file  . Attends Archivist Meetings: Not on file  . Marital Status: Not on file  Intimate Partner Violence:   . Fear of Current or Ex-Partner: Not on file  . Emotionally Abused: Not on file  . Physically Abused: Not on file  . Sexually Abused: Not on file    Review of Systems  Constitutional: Negative.   HENT: Negative.   Eyes: Negative.   Respiratory: Negative.   Cardiovascular: Negative.   Gastrointestinal: Negative.   Endocrine: Negative.   Genitourinary: Negative.   Musculoskeletal: Negative.   Skin: Negative.   Allergic/Immunologic: Negative.   Neurological: Negative.   Hematological: Negative.   Psychiatric/Behavioral: Negative.     PHYSICAL EXAMINATION:    BP 116/68 (BP Location: Right Arm, Patient Position: Sitting, Cuff Size: Normal)   Pulse 76   Resp 14   Ht 5' 3.5" (1.613 m)   Wt 219 lb (99.3 kg)   LMP 02/11/2009 (Approximate)   BMI 38.19 kg/m     General appearance: alert, cooperative and appears stated age   Pelvic: External genitalia:  no lesions              Urethra:  normal appearing urethra with no masses, tenderness or lesions              Bartholins and Skenes: normal                 Vagina: polypoid lesion of the right mid-upper vagina - 15 mm x 7 mm.               Cervix: no lesions. Stenotic os.  EMB and vaginal lesion biopsy Consent for procedures.  Sterile prep with Hibiclens. Local 1% lidocaine to cervix and vaginal lesion - [id number], exp 5/24. Scalpel used to open cervical os.   Clear mucous drained.  Tenaculum to anterior cervical lip.  Pipelle passed x 2 to 7 cm.  Tissue to pathology.   Tischler used to biopsy the polypoid vaginal lesion. Silver nitrate used and then Monsel's.   Vaginal instruments removed.  Minimal EBL.  No complications.    Chaperone was present for exam.  ASSESSMENT  Fluid  in endometrial canal with thickened endometrium.  Vaginal polypoid lesion.  PLAN  Fu biopsy results.  Instructions and precautions given.  Final plan to follow.

## 2019-11-26 NOTE — Patient Instructions (Signed)

## 2019-11-29 ENCOUNTER — Encounter: Payer: Self-pay | Admitting: Podiatry

## 2019-11-29 ENCOUNTER — Ambulatory Visit: Payer: Federal, State, Local not specified - PPO | Admitting: Podiatry

## 2019-11-29 ENCOUNTER — Other Ambulatory Visit: Payer: Self-pay

## 2019-11-29 ENCOUNTER — Ambulatory Visit (INDEPENDENT_AMBULATORY_CARE_PROVIDER_SITE_OTHER): Payer: Federal, State, Local not specified - PPO

## 2019-11-29 DIAGNOSIS — B070 Plantar wart: Secondary | ICD-10-CM | POA: Diagnosis not present

## 2019-11-29 DIAGNOSIS — M795 Residual foreign body in soft tissue: Secondary | ICD-10-CM

## 2019-11-29 DIAGNOSIS — M7661 Achilles tendinitis, right leg: Secondary | ICD-10-CM | POA: Diagnosis not present

## 2019-11-29 DIAGNOSIS — M7662 Achilles tendinitis, left leg: Secondary | ICD-10-CM

## 2019-11-29 LAB — SURGICAL PATHOLOGY

## 2019-11-30 NOTE — Progress Notes (Signed)
Subjective:   Patient ID: Sheri Lara, female   DOB: 54 y.o.   MRN: [id number]   HPI Patient presents stating she thinks she stepped on something on her left foot and its been sore for several months and states that she never was able to pull anything out of it.  Also states she needs new orthotics due to her activity levels and states that she needs a pair for her boots that the other ones are wearing out and they have been useful but not as good as recently   ROS      Objective:  Physical Exam  Neurovascular status was found to be intact with a lesion underneath the left forefoot measuring about 5 x 5 mm painful to lateral pressure.  Upon debridement it shows pinpoint bleeding and I did note that there is moderate depression of the arch bilateral with stress that occurs on the heel and into the arch     Assessment:  Probability for verruca plantaris plantar left along with structural abnormality of both feet which creates chronic pain     Plan:  H&P reviewed condition and reviewed x-ray left.  Today sterile sharp debridement accomplished and applied a prep to create immune response with sterile dressing left educated her on this and what to do if any blistering were to occur.  Casted for new functional orthotics and I have recommended a [place] type orthotic with deep heel seat to provide for support and take stress off the chronic heel that she develops

## 2019-12-01 ENCOUNTER — Encounter: Payer: Self-pay | Admitting: Obstetrics and Gynecology

## 2019-12-01 ENCOUNTER — Telehealth: Payer: Self-pay

## 2019-12-01 NOTE — Telephone Encounter (Signed)
-----   Message from Nunzio Cobbs, MD sent at 12/01/2019 12:52 PM EDT ----- Please contact patient with results of her office biopsies showing benign atrophy of the endometrium and a benign vaginal polyp. No further evaluation or treatment is needed.  I am highlighting this result so you know to contact the patient.

## 2019-12-01 NOTE — Telephone Encounter (Signed)
Left message to call Mariano Doshi, CMA. °

## 2019-12-20 NOTE — Telephone Encounter (Signed)
Patient left detailed message with results on 11-17-19--see result note.

## 2020-01-02 ENCOUNTER — Other Ambulatory Visit: Payer: Self-pay | Admitting: Internal Medicine

## 2020-01-19 ENCOUNTER — Other Ambulatory Visit: Payer: Self-pay | Admitting: Internal Medicine

## 2020-02-15 DIAGNOSIS — Z23 Encounter for immunization: Secondary | ICD-10-CM | POA: Diagnosis not present

## 2020-04-27 DIAGNOSIS — Z01812 Encounter for preprocedural laboratory examination: Secondary | ICD-10-CM | POA: Diagnosis not present

## 2020-05-02 DIAGNOSIS — K3189 Other diseases of stomach and duodenum: Secondary | ICD-10-CM | POA: Diagnosis not present

## 2020-05-02 DIAGNOSIS — K2930 Chronic superficial gastritis without bleeding: Secondary | ICD-10-CM | POA: Diagnosis not present

## 2020-05-02 DIAGNOSIS — R140 Abdominal distension (gaseous): Secondary | ICD-10-CM | POA: Diagnosis not present

## 2020-05-02 DIAGNOSIS — R1310 Dysphagia, unspecified: Secondary | ICD-10-CM | POA: Diagnosis not present

## 2020-05-02 DIAGNOSIS — R1013 Epigastric pain: Secondary | ICD-10-CM | POA: Diagnosis not present

## 2020-05-02 DIAGNOSIS — R197 Diarrhea, unspecified: Secondary | ICD-10-CM | POA: Diagnosis not present

## 2020-05-02 DIAGNOSIS — R12 Heartburn: Secondary | ICD-10-CM | POA: Diagnosis not present

## 2020-05-02 DIAGNOSIS — K2289 Other specified disease of esophagus: Secondary | ICD-10-CM | POA: Diagnosis not present

## 2020-07-21 DIAGNOSIS — H2513 Age-related nuclear cataract, bilateral: Secondary | ICD-10-CM | POA: Diagnosis not present

## 2020-07-21 DIAGNOSIS — H52222 Regular astigmatism, left eye: Secondary | ICD-10-CM | POA: Diagnosis not present

## 2020-07-21 DIAGNOSIS — E119 Type 2 diabetes mellitus without complications: Secondary | ICD-10-CM | POA: Diagnosis not present

## 2020-07-21 DIAGNOSIS — H524 Presbyopia: Secondary | ICD-10-CM | POA: Diagnosis not present

## 2020-07-21 DIAGNOSIS — H5201 Hypermetropia, right eye: Secondary | ICD-10-CM | POA: Diagnosis not present

## 2020-07-21 DIAGNOSIS — H52221 Regular astigmatism, right eye: Secondary | ICD-10-CM | POA: Diagnosis not present

## 2020-09-01 DIAGNOSIS — Z20822 Contact with and (suspected) exposure to covid-19: Secondary | ICD-10-CM | POA: Diagnosis not present

## 2020-09-02 DIAGNOSIS — R197 Diarrhea, unspecified: Secondary | ICD-10-CM | POA: Diagnosis not present

## 2020-09-05 ENCOUNTER — Other Ambulatory Visit: Payer: Self-pay | Admitting: Family Medicine

## 2020-09-05 DIAGNOSIS — Z1231 Encounter for screening mammogram for malignant neoplasm of breast: Secondary | ICD-10-CM

## 2020-09-07 ENCOUNTER — Ambulatory Visit: Payer: Federal, State, Local not specified - PPO

## 2020-09-20 ENCOUNTER — Other Ambulatory Visit: Payer: Self-pay

## 2020-09-20 ENCOUNTER — Ambulatory Visit
Admission: RE | Admit: 2020-09-20 | Discharge: 2020-09-20 | Disposition: A | Discharge status: Home / Self Care | Payer: Federal, State, Local not specified - PPO | Source: Ambulatory Visit | Attending: Family Medicine | Admitting: Family Medicine

## 2020-09-20 DIAGNOSIS — Z1231 Encounter for screening mammogram for malignant neoplasm of breast: Secondary | ICD-10-CM

## 2020-10-09 DIAGNOSIS — E559 Vitamin D deficiency, unspecified: Secondary | ICD-10-CM | POA: Diagnosis not present

## 2020-10-09 DIAGNOSIS — E1165 Type 2 diabetes mellitus with hyperglycemia: Secondary | ICD-10-CM | POA: Diagnosis not present

## 2020-10-09 DIAGNOSIS — E7800 Pure hypercholesterolemia, unspecified: Secondary | ICD-10-CM | POA: Diagnosis not present

## 2020-10-09 DIAGNOSIS — Z0000 Encounter for general adult medical examination without abnormal findings: Secondary | ICD-10-CM | POA: Diagnosis not present

## 2020-10-09 DIAGNOSIS — N200 Calculus of kidney: Secondary | ICD-10-CM | POA: Diagnosis not present

## 2020-12-04 DIAGNOSIS — S40011A Contusion of right shoulder, initial encounter: Secondary | ICD-10-CM | POA: Diagnosis not present

## 2020-12-04 DIAGNOSIS — Z7985 Long-term (current) use of injectable non-insulin antidiabetic drugs: Secondary | ICD-10-CM | POA: Diagnosis not present

## 2020-12-04 DIAGNOSIS — E119 Type 2 diabetes mellitus without complications: Secondary | ICD-10-CM | POA: Diagnosis not present

## 2020-12-04 DIAGNOSIS — S161XXA Strain of muscle, fascia and tendon at neck level, initial encounter: Secondary | ICD-10-CM | POA: Diagnosis not present

## 2020-12-04 DIAGNOSIS — Z7984 Long term (current) use of oral hypoglycemic drugs: Secondary | ICD-10-CM | POA: Diagnosis not present

## 2020-12-05 DIAGNOSIS — S40011D Contusion of right shoulder, subsequent encounter: Secondary | ICD-10-CM | POA: Diagnosis not present

## 2020-12-05 DIAGNOSIS — S46811D Strain of other muscles, fascia and tendons at shoulder and upper arm level, right arm, subsequent encounter: Secondary | ICD-10-CM | POA: Diagnosis not present

## 2020-12-08 DIAGNOSIS — S46811D Strain of other muscles, fascia and tendons at shoulder and upper arm level, right arm, subsequent encounter: Secondary | ICD-10-CM | POA: Diagnosis not present

## 2020-12-08 DIAGNOSIS — S40011D Contusion of right shoulder, subsequent encounter: Secondary | ICD-10-CM | POA: Diagnosis not present

## 2020-12-12 DIAGNOSIS — S40011D Contusion of right shoulder, subsequent encounter: Secondary | ICD-10-CM | POA: Diagnosis not present

## 2020-12-12 DIAGNOSIS — S46811D Strain of other muscles, fascia and tendons at shoulder and upper arm level, right arm, subsequent encounter: Secondary | ICD-10-CM | POA: Diagnosis not present

## 2020-12-14 DIAGNOSIS — S40011D Contusion of right shoulder, subsequent encounter: Secondary | ICD-10-CM | POA: Diagnosis not present

## 2020-12-14 DIAGNOSIS — S46811D Strain of other muscles, fascia and tendons at shoulder and upper arm level, right arm, subsequent encounter: Secondary | ICD-10-CM | POA: Diagnosis not present

## 2020-12-18 DIAGNOSIS — M542 Cervicalgia: Secondary | ICD-10-CM | POA: Diagnosis not present

## 2020-12-19 DIAGNOSIS — S46811D Strain of other muscles, fascia and tendons at shoulder and upper arm level, right arm, subsequent encounter: Secondary | ICD-10-CM | POA: Diagnosis not present

## 2020-12-19 DIAGNOSIS — S40011D Contusion of right shoulder, subsequent encounter: Secondary | ICD-10-CM | POA: Diagnosis not present

## 2020-12-22 DIAGNOSIS — S40011D Contusion of right shoulder, subsequent encounter: Secondary | ICD-10-CM | POA: Diagnosis not present

## 2020-12-22 DIAGNOSIS — S46811D Strain of other muscles, fascia and tendons at shoulder and upper arm level, right arm, subsequent encounter: Secondary | ICD-10-CM | POA: Diagnosis not present

## 2020-12-26 DIAGNOSIS — S40011D Contusion of right shoulder, subsequent encounter: Secondary | ICD-10-CM | POA: Diagnosis not present

## 2020-12-26 DIAGNOSIS — S46811D Strain of other muscles, fascia and tendons at shoulder and upper arm level, right arm, subsequent encounter: Secondary | ICD-10-CM | POA: Diagnosis not present

## 2020-12-28 DIAGNOSIS — S46811D Strain of other muscles, fascia and tendons at shoulder and upper arm level, right arm, subsequent encounter: Secondary | ICD-10-CM | POA: Diagnosis not present

## 2020-12-28 DIAGNOSIS — S40011D Contusion of right shoulder, subsequent encounter: Secondary | ICD-10-CM | POA: Diagnosis not present

## 2021-01-01 DIAGNOSIS — S40011D Contusion of right shoulder, subsequent encounter: Secondary | ICD-10-CM | POA: Diagnosis not present

## 2021-01-01 DIAGNOSIS — S46811D Strain of other muscles, fascia and tendons at shoulder and upper arm level, right arm, subsequent encounter: Secondary | ICD-10-CM | POA: Diagnosis not present

## 2021-01-02 DIAGNOSIS — R399 Unspecified symptoms and signs involving the genitourinary system: Secondary | ICD-10-CM | POA: Diagnosis not present

## 2021-01-02 DIAGNOSIS — E1165 Type 2 diabetes mellitus with hyperglycemia: Secondary | ICD-10-CM | POA: Diagnosis not present

## 2021-01-02 DIAGNOSIS — Z7984 Long term (current) use of oral hypoglycemic drugs: Secondary | ICD-10-CM | POA: Diagnosis not present

## 2021-01-02 DIAGNOSIS — R55 Syncope and collapse: Secondary | ICD-10-CM | POA: Diagnosis not present

## 2021-01-09 DIAGNOSIS — S40011D Contusion of right shoulder, subsequent encounter: Secondary | ICD-10-CM | POA: Diagnosis not present

## 2021-01-15 DIAGNOSIS — D72829 Elevated white blood cell count, unspecified: Secondary | ICD-10-CM | POA: Diagnosis not present

## 2021-01-15 DIAGNOSIS — E1165 Type 2 diabetes mellitus with hyperglycemia: Secondary | ICD-10-CM | POA: Diagnosis not present

## 2021-02-27 ENCOUNTER — Encounter: Payer: Self-pay | Admitting: Obstetrics and Gynecology

## 2021-02-27 ENCOUNTER — Other Ambulatory Visit: Payer: Self-pay

## 2021-02-27 ENCOUNTER — Ambulatory Visit (INDEPENDENT_AMBULATORY_CARE_PROVIDER_SITE_OTHER): Payer: Federal, State, Local not specified - PPO | Admitting: Obstetrics and Gynecology

## 2021-02-27 VITALS — BP 120/74 | HR 101 | Ht 63.0 in | Wt 204.0 lb

## 2021-02-27 DIAGNOSIS — R102 Pelvic and perineal pain: Secondary | ICD-10-CM | POA: Diagnosis not present

## 2021-02-27 DIAGNOSIS — R1031 Right lower quadrant pain: Secondary | ICD-10-CM

## 2021-02-27 DIAGNOSIS — Z01419 Encounter for gynecological examination (general) (routine) without abnormal findings: Secondary | ICD-10-CM | POA: Diagnosis not present

## 2021-02-27 DIAGNOSIS — G8929 Other chronic pain: Secondary | ICD-10-CM

## 2021-02-27 NOTE — Progress Notes (Signed)
56 y.o. G0P0000 Unknown American Indian/Caucasian female here for annual exam.    Doing PT for a back contusion.  She is being followed for elevated white blood cell count through her PCP.   Denies vaginal bleeding.   She is having abdominal pain, RLQ and sometimes midline since November, 2022.  Hx renal stones.  No nausea or vomiting.  She has reflux.  Some constipation with Ozempic.  Denies dysuria.   Pelvic US 11/25/19 showed 3 fibroids, larges 4.2 cm.  EMS was 5.42 mm with fluid in endometrial canal.  2.4 cm simple right ovarian cyst noted.  EMB done 11/26/19 showed atrophic benign endometrium.   No change in sexual partner.  Not currently sexually active.   Hx renal stones.   PCP:  Lujean Amel, MD  Patient's last menstrual period was 02/11/2009 (approximate).           Sexually active: No.  The current method of family planning is post menopausal status.    Exercising: Yes.     Physical therapy for back Smoker:  no  Health Maintenance: Pap:  11-04-19 Neg:Neg HR HPV, 2018 normal History of abnormal Pap:  no MMG:  09-20-20 Neg/BiRads1 Colonoscopy:  2020 normal;next 10 years--Dr.Hung BMD:   n/a  Result  n/a TDaP:  2018 Gardasil:   no HIV:Neg in the past Hep C:Neg in the past Screening Labs: PCP Flu vaccine:  completed.  Covid bivalent vaccine:  completed.    reports that she has never smoked. She has never used smokeless tobacco. She reports current alcohol use. She reports that she does not use drugs.  Past Medical History:  Diagnosis Date   Asthma    Diabetes mellitus without complication ([place])    Fibroid    History of kidney stones    Vaginal polyp 2021   biopsy is benign    Past Surgical History:  Procedure Laterality Date   DENTAL SURGERY     LITHOTRIPSY  2016   [place]   TONSILLECTOMY      Current Outpatient Medications  Medication Sig Dispense Refill   atorvastatin (LIPITOR) 10 MG tablet Take 10 mg by mouth daily.      Blood Glucose  Monitoring Suppl ([place]) w/Device KIT See admin instructions.     budesonide-formoterol (SYMBICORT) 160-4.5 MCG/ACT inhaler Symbicort 160 mcg-4.5 mcg/actuation HFA aerosol inhaler  TAKE 2 PUFFS BY MOUTH TWICE A DAY     calcium carbonate (OS-CAL) 1250 (500 Ca) MG chewable tablet Chew by mouth.     cetirizine (ZYRTEC) 10 MG tablet 1 tablet     cyclobenzaprine (FLEXERIL) 5 MG tablet Take 5 mg by mouth at bedtime.     ergocalciferol (VITAMIN D2) 1.25 MG (50000 UT) capsule Take 1 capsule by mouth once a week.     glucose blood (ONETOUCH VERIO) test strip See admin instructions.     ibuprofen (ADVIL) 600 MG tablet Take 600 mg by mouth 3 (three) times daily as needed.     metFORMIN (GLUCOPHAGE-XR) 500 MG 24 hr tablet Take 2,000 mg by mouth at bedtime. [id number]     Methyl Salicylate 25 % CREA Apply topically.     omeprazole (PRILOSEC) 40 MG capsule Take 40 mg by mouth every morning.     OneTouch Delica Lancets 93O MISC See admin instructions.     rosuvastatin (CRESTOR) 5 MG tablet 1 tablet     Semaglutide, 1 MG/DOSE, (OZEMPIC, 1 MG/DOSE,) 4 MG/3ML SOPN 73m     TRULICITY 3 MIZ/1.2WPSOPN SMARTSIG:3  Milligram(s) SUB-Q Once a Week     No current facility-administered medications for this visit.    Family History  Problem Relation Age of Onset   Cancer Mother        ?metastatic lung ca   Diabetes Mother    Cancer Father        melanoma   Heart disease Father     Review of Systems  All other systems reviewed and are negative.  Exam:   BP 120/74    Pulse (!) 101    Ht '5\' 3"'  (1.6 m)    Wt 204 lb (92.5 kg)    LMP 02/11/2009 (Approximate)    SpO2 98%    BMI 36.14 kg/m     General appearance: alert, cooperative and appears stated age Head: normocephalic, without obvious abnormality, atraumatic Neck: no adenopathy, supple, symmetrical, trachea midline and thyroid normal to inspection and palpation Lungs: clear to auscultation bilaterally Breasts: normal appearance, no masses or  tenderness, No nipple retraction or dimpling, No nipple discharge or bleeding, No axillary adenopathy Heart: regular rate and rhythm Abdomen: soft, non-tender; no masses, no organomegaly Extremities: extremities normal, atraumatic, no cyanosis or edema Skin: skin color, texture, turgor normal. No rashes or lesions Lymph nodes: cervical, supraclavicular, and axillary nodes normal. Neurologic: grossly normal  Pelvic: External genitalia:  no lesions              No abnormal inguinal nodes palpated.              Urethra:  normal appearing urethra with no masses, tenderness or lesions              Bartholins and Skenes: normal                 Vagina: normal appearing vagina with normal color and discharge, no lesions              Cervix: no lesions              Pap taken: no Bimanual Exam:  Uterus:  normal size, contour, position, consistency, mobility, non-tender              Adnexa: no mass, fullness, tenderness              Rectal exam: yes.  Confirms.              Anus:  normal sphincter tone, no lesions  Chaperone was present for exam:  Estill Bamberg, CMA  Assessment:   Well woman visit with gynecologic exam. Chronic RLQ pain.  Pelvic pain.  Hx fibroids.  Hx small simple right ovarian cyst.  Plan: Mammogram screening discussed. Self breast awareness reviewed. Pap and HR HPV 2026. Guidelines for Calcium, Vitamin D, regular exercise program including cardiovascular and weight bearing exercise. Return for pelvic ultrasound.  Follow up annually and prn.   After visit summary provided.

## 2021-02-28 ENCOUNTER — Telehealth: Payer: Self-pay | Admitting: *Deleted

## 2021-02-28 DIAGNOSIS — G8929 Other chronic pain: Secondary | ICD-10-CM

## 2021-02-28 DIAGNOSIS — R102 Pelvic and perineal pain: Secondary | ICD-10-CM

## 2021-02-28 NOTE — Patient Instructions (Signed)

## 2021-02-28 NOTE — Telephone Encounter (Signed)
Left detailed message on voicemail per DPR access number to call to schedule [phone] option 1, then option 5. New order placed.

## 2021-02-28 NOTE — Telephone Encounter (Signed)
Patient was seen for annual exam was told to schedule pelvic ultrasound reports she would like to have pelvic ultrasound done at [facility] because they have more open dates to schedule. Okay to place order with [facility]?

## 2021-02-28 NOTE — Telephone Encounter (Signed)
Ok to schedule at [place].

## 2021-03-06 NOTE — Telephone Encounter (Signed)
I called patient again regarding scheduling ultrasound. [facility] has called and left message x 1 for patient to call. I called patient to confirm she received my voicemail the 1st time about scheduling,detailed message left on voicemail again.

## 2021-03-07 NOTE — Telephone Encounter (Signed)
Ultrasound scheduled on 03/16/21

## 2021-03-13 ENCOUNTER — Other Ambulatory Visit: Payer: Federal, State, Local not specified - PPO

## 2021-03-13 ENCOUNTER — Other Ambulatory Visit: Payer: Federal, State, Local not specified - PPO | Admitting: Obstetrics and Gynecology

## 2021-03-16 ENCOUNTER — Ambulatory Visit: Payer: Federal, State, Local not specified - PPO | Admitting: Obstetrics and Gynecology

## 2021-03-16 ENCOUNTER — Ambulatory Visit
Admission: RE | Admit: 2021-03-16 | Discharge: 2021-03-16 | Disposition: A | Discharge status: Home / Self Care | Payer: Federal, State, Local not specified - PPO | Source: Ambulatory Visit | Attending: Obstetrics and Gynecology | Admitting: Obstetrics and Gynecology

## 2021-03-16 DIAGNOSIS — R1031 Right lower quadrant pain: Secondary | ICD-10-CM

## 2021-03-16 DIAGNOSIS — N3289 Other specified disorders of bladder: Secondary | ICD-10-CM | POA: Diagnosis not present

## 2021-03-16 DIAGNOSIS — D259 Leiomyoma of uterus, unspecified: Secondary | ICD-10-CM | POA: Diagnosis not present

## 2021-03-16 DIAGNOSIS — R102 Pelvic and perineal pain: Secondary | ICD-10-CM

## 2021-03-16 DIAGNOSIS — G8929 Other chronic pain: Secondary | ICD-10-CM

## 2021-03-26 DIAGNOSIS — D72829 Elevated white blood cell count, unspecified: Secondary | ICD-10-CM | POA: Diagnosis not present

## 2021-05-29 DIAGNOSIS — R109 Unspecified abdominal pain: Secondary | ICD-10-CM | POA: Diagnosis not present

## 2021-05-29 DIAGNOSIS — R197 Diarrhea, unspecified: Secondary | ICD-10-CM | POA: Diagnosis not present

## 2021-05-29 DIAGNOSIS — R110 Nausea: Secondary | ICD-10-CM | POA: Diagnosis not present

## 2021-06-27 DIAGNOSIS — E119 Type 2 diabetes mellitus without complications: Secondary | ICD-10-CM | POA: Diagnosis not present

## 2021-06-27 DIAGNOSIS — H5201 Hypermetropia, right eye: Secondary | ICD-10-CM | POA: Diagnosis not present

## 2021-06-27 DIAGNOSIS — H524 Presbyopia: Secondary | ICD-10-CM | POA: Diagnosis not present

## 2021-06-27 DIAGNOSIS — H53001 Unspecified amblyopia, right eye: Secondary | ICD-10-CM | POA: Diagnosis not present

## 2021-07-02 DIAGNOSIS — E1165 Type 2 diabetes mellitus with hyperglycemia: Secondary | ICD-10-CM | POA: Diagnosis not present

## 2021-07-02 DIAGNOSIS — K080 Exfoliation of teeth due to systemic causes: Secondary | ICD-10-CM | POA: Diagnosis not present

## 2021-09-13 DIAGNOSIS — M2540 Effusion, unspecified joint: Secondary | ICD-10-CM | POA: Diagnosis not present

## 2021-09-14 ENCOUNTER — Other Ambulatory Visit: Payer: Self-pay | Admitting: Family Medicine

## 2021-09-14 ENCOUNTER — Ambulatory Visit
Admission: RE | Admit: 2021-09-14 | Discharge: 2021-09-14 | Disposition: A | Discharge status: Home / Self Care | Payer: Federal, State, Local not specified - PPO | Source: Ambulatory Visit | Attending: Family Medicine | Admitting: Family Medicine

## 2021-09-14 DIAGNOSIS — M2540 Effusion, unspecified joint: Secondary | ICD-10-CM

## 2021-09-14 DIAGNOSIS — M79641 Pain in right hand: Secondary | ICD-10-CM | POA: Diagnosis not present

## 2021-09-14 DIAGNOSIS — M7989 Other specified soft tissue disorders: Secondary | ICD-10-CM | POA: Diagnosis not present

## 2021-10-24 ENCOUNTER — Other Ambulatory Visit: Payer: Self-pay | Admitting: Family Medicine

## 2021-10-24 ENCOUNTER — Other Ambulatory Visit (HOSPITAL_BASED_OUTPATIENT_CLINIC_OR_DEPARTMENT_OTHER): Payer: Self-pay

## 2021-10-24 DIAGNOSIS — M2540 Effusion, unspecified joint: Secondary | ICD-10-CM | POA: Diagnosis not present

## 2021-10-24 DIAGNOSIS — K219 Gastro-esophageal reflux disease without esophagitis: Secondary | ICD-10-CM | POA: Diagnosis not present

## 2021-10-24 DIAGNOSIS — Z79899 Other long term (current) drug therapy: Secondary | ICD-10-CM | POA: Diagnosis not present

## 2021-10-24 DIAGNOSIS — E1165 Type 2 diabetes mellitus with hyperglycemia: Secondary | ICD-10-CM | POA: Diagnosis not present

## 2021-10-24 DIAGNOSIS — E7800 Pure hypercholesterolemia, unspecified: Secondary | ICD-10-CM | POA: Diagnosis not present

## 2021-10-24 DIAGNOSIS — Z0000 Encounter for general adult medical examination without abnormal findings: Secondary | ICD-10-CM | POA: Diagnosis not present

## 2021-10-24 DIAGNOSIS — E559 Vitamin D deficiency, unspecified: Secondary | ICD-10-CM | POA: Diagnosis not present

## 2021-10-24 DIAGNOSIS — K2930 Chronic superficial gastritis without bleeding: Secondary | ICD-10-CM | POA: Diagnosis not present

## 2021-10-24 DIAGNOSIS — Z1231 Encounter for screening mammogram for malignant neoplasm of breast: Secondary | ICD-10-CM

## 2021-10-24 DIAGNOSIS — Z23 Encounter for immunization: Secondary | ICD-10-CM | POA: Diagnosis not present

## 2021-10-24 MED ORDER — ALBUTEROL SULFATE HFA 108 (90 BASE) MCG/ACT IN AERS
2.0000 | INHALATION_SPRAY | RESPIRATORY_TRACT | 2 refills | Status: AC
  Filled 2021-10-24: qty 6.7, 17d supply, fill #0

## 2021-10-25 DIAGNOSIS — Z0000 Encounter for general adult medical examination without abnormal findings: Secondary | ICD-10-CM | POA: Diagnosis not present

## 2021-10-25 DIAGNOSIS — R300 Dysuria: Secondary | ICD-10-CM | POA: Diagnosis not present

## 2021-10-29 ENCOUNTER — Other Ambulatory Visit (HOSPITAL_BASED_OUTPATIENT_CLINIC_OR_DEPARTMENT_OTHER): Payer: Self-pay

## 2021-10-30 ENCOUNTER — Other Ambulatory Visit (HOSPITAL_BASED_OUTPATIENT_CLINIC_OR_DEPARTMENT_OTHER): Payer: Self-pay

## 2021-10-30 MED ORDER — OZEMPIC (2 MG/DOSE) 8 MG/3ML ~~LOC~~ SOPN
PEN_INJECTOR | SUBCUTANEOUS | 5 refills | Status: DC
  Filled 2021-10-30: qty 9, 84d supply, fill #0
  Filled 2022-03-15: qty 9, 84d supply, fill #1

## 2021-10-30 MED ORDER — VITAMIN D (ERGOCALCIFEROL) 1.25 MG (50000 UNIT) PO CAPS
50000.0000 [IU] | ORAL_CAPSULE | ORAL | 5 refills | Status: AC
  Filled 2021-10-30: qty 4, 28d supply, fill #0

## 2021-11-01 ENCOUNTER — Other Ambulatory Visit (HOSPITAL_BASED_OUTPATIENT_CLINIC_OR_DEPARTMENT_OTHER): Payer: Self-pay

## 2021-11-19 ENCOUNTER — Ambulatory Visit
Admission: RE | Admit: 2021-11-19 | Discharge: 2021-11-19 | Disposition: A | Discharge status: Home / Self Care | Payer: Federal, State, Local not specified - PPO | Source: Ambulatory Visit | Attending: Family Medicine | Admitting: Family Medicine

## 2021-11-19 DIAGNOSIS — Z1231 Encounter for screening mammogram for malignant neoplasm of breast: Secondary | ICD-10-CM

## 2021-12-16 ENCOUNTER — Encounter (HOSPITAL_BASED_OUTPATIENT_CLINIC_OR_DEPARTMENT_OTHER): Payer: Self-pay | Admitting: *Deleted

## 2021-12-16 ENCOUNTER — Other Ambulatory Visit: Payer: Self-pay

## 2021-12-16 ENCOUNTER — Emergency Department (HOSPITAL_BASED_OUTPATIENT_CLINIC_OR_DEPARTMENT_OTHER)
Admission: EM | Admit: 2021-12-16 | Discharge: 2021-12-17 | Disposition: A | Discharge status: Home / Self Care | Payer: Federal, State, Local not specified - PPO | Attending: Emergency Medicine | Admitting: Emergency Medicine

## 2021-12-16 DIAGNOSIS — S91052A Open bite, left ankle, initial encounter: Secondary | ICD-10-CM | POA: Insufficient documentation

## 2021-12-16 DIAGNOSIS — S99912A Unspecified injury of left ankle, initial encounter: Secondary | ICD-10-CM | POA: Diagnosis not present

## 2021-12-16 DIAGNOSIS — D72829 Elevated white blood cell count, unspecified: Secondary | ICD-10-CM | POA: Insufficient documentation

## 2021-12-16 DIAGNOSIS — Z7984 Long term (current) use of oral hypoglycemic drugs: Secondary | ICD-10-CM | POA: Diagnosis not present

## 2021-12-16 DIAGNOSIS — T63001A Toxic effect of unspecified snake venom, accidental (unintentional), initial encounter: Secondary | ICD-10-CM | POA: Diagnosis not present

## 2021-12-16 DIAGNOSIS — W5911XA Bitten by nonvenomous snake, initial encounter: Secondary | ICD-10-CM | POA: Insufficient documentation

## 2021-12-16 DIAGNOSIS — M7989 Other specified soft tissue disorders: Secondary | ICD-10-CM | POA: Diagnosis not present

## 2021-12-16 DIAGNOSIS — E119 Type 2 diabetes mellitus without complications: Secondary | ICD-10-CM | POA: Insufficient documentation

## 2021-12-16 LAB — COMPREHENSIVE METABOLIC PANEL
ALT: 16 U/L (ref 0–44)
AST: 15 U/L (ref 15–41)
Albumin: 4.4 g/dL (ref 3.5–5.0)
Alkaline Phosphatase: 76 U/L (ref 38–126)
Anion gap: 10 (ref 5–15)
BUN: 12 mg/dL (ref 6–20)
CO2: 27 mmol/L (ref 22–32)
Calcium: 9.6 mg/dL (ref 8.9–10.3)
Chloride: 103 mmol/L (ref 98–111)
Creatinine, Ser: 0.76 mg/dL (ref 0.44–1.00)
GFR, Estimated: >60 mL/min (ref >60)
Glucose, Bld: 152 mg/dL — ABNORMAL HIGH (ref 70–99)
Potassium: 3.5 mmol/L (ref 3.5–5.1)
Sodium: 140 mmol/L (ref 135–145)
Total Bilirubin: 0.3 mg/dL (ref 0.3–1.2)
Total Protein: 7.5 g/dL (ref 6.5–8.1)

## 2021-12-16 LAB — CBC WITH DIFFERENTIAL/PLATELET
Abs Immature Granulocytes: 0.07 10*3/uL (ref 0.00–0.07)
Basophils Absolute: 0 10*3/uL (ref 0.0–0.1)
Basophils Relative: 0 %
Eosinophils Absolute: 0.1 10*3/uL (ref 0.0–0.5)
Eosinophils Relative: 0 %
HCT: 40.4 % (ref 36.0–46.0)
Hemoglobin: 13.3 g/dL (ref 12.0–15.0)
Immature Granulocytes: 0 %
Lymphocytes Relative: 21 %
Lymphs Abs: 3.6 10*3/uL (ref 0.7–4.0)
MCH: 27 pg (ref 26.0–34.0)
MCHC: 32.9 g/dL (ref 30.0–36.0)
MCV: 81.9 fL (ref 80.0–100.0)
Monocytes Absolute: 0.9 10*3/uL (ref 0.1–1.0)
Monocytes Relative: 5 %
Neutro Abs: 12 10*3/uL — ABNORMAL HIGH (ref 1.7–7.7)
Neutrophils Relative %: 74 %
Platelets: 437 10*3/uL — ABNORMAL HIGH (ref 150–400)
RBC: 4.93 MIL/uL (ref 3.87–5.11)
RDW: 12.9 % (ref 11.5–15.5)
WBC: 16.6 10*3/uL — ABNORMAL HIGH (ref 4.0–10.5)
nRBC: 0 % (ref 0.0–0.2)

## 2021-12-16 LAB — PROTIME-INR
INR: 0.9 (ref 0.8–1.2)
Prothrombin Time: 12.2 seconds (ref 11.4–15.2)

## 2021-12-16 LAB — APTT: aPTT: 35 seconds (ref 24–36)

## 2021-12-16 NOTE — ED Notes (Addendum)
This RN called poison control regarding potential snake bite to left outer ankle. Information provided to [facility] at [place] control and recommendations will be faxed over. Swelling to left ankle has been marked and left leg has been elevated. Circumference of  left leg will be measured to 3 areas (at site, below the knee, and above the knee) as recommended by Lennette Bihari will be done every hour times 4.

## 2021-12-16 NOTE — ED Provider Notes (Signed)
[facility] EMERGENCY DEPT Provider Note   CSN: [id number] Arrival date & time: 12/16/21  1650     History  Chief Complaint  Patient presents with   Snake Bite    Sheri Lara is a 56 y.o. female with T2DM, Dyslipidemia presents with snake bite.  Patient was struck in the leg with something while out leaf blowing, she believes she was bitten by a snake. Was a forceful hit to her lateral left ankle and she looked and had two puncture wounds to the area with mild erythema and swelling. This occurred at 1630 PM today.  She did not see the snake that caused this and could not find it to take a photo. Has no h/o similar. Since then has had mild swelling and pain which has improved with elevation while in the waiting room of the ED. No lightheadedness, CP, SOB, bleeding, migrating erythema, N/T, N/V, abd pain. Husband is also a patient for bee stings accompanied her to hospital.   HPI     Home Medications Prior to Admission medications   Medication Sig Start Date End Date Taking? Authorizing Provider  albuterol (PROAIR HFA) 108 (90 Base) MCG/ACT inhaler Inhale 2 puffs into the lungs every 4 (four) hours. 10/24/21 11/23/21    atorvastatin (LIPITOR) 10 MG tablet Take 10 mg by mouth daily.  09/21/19   [provider]  Blood Glucose Monitoring Suppl ([place]) w/Device KIT See admin instructions. 06/06/17   [provider]  budesonide-formoterol (SYMBICORT) 160-4.5 MCG/ACT inhaler Symbicort 160 mcg-4.5 mcg/actuation HFA aerosol inhaler  TAKE 2 PUFFS BY MOUTH TWICE A DAY    [provider]  calcium carbonate (OS-CAL) 1250 (500 Ca) MG chewable tablet Chew by mouth.    [provider]  cetirizine (ZYRTEC) 10 MG tablet 1 tablet    [provider]  cyclobenzaprine (FLEXERIL) 5 MG tablet Take 5 mg by mouth at bedtime. 01/09/21   [provider]  ergocalciferol (VITAMIN D2) 1.25 MG (50000 UT) capsule Take 1  capsule by mouth once a week. 04/07/14   [provider]  glucose blood (ONETOUCH VERIO) test strip See admin instructions. 06/06/17   [provider]  ibuprofen (ADVIL) 600 MG tablet Take 600 mg by mouth 3 (three) times daily as needed. 12/04/20   [provider]  metFORMIN (GLUCOPHAGE-XR) 500 MG 24 hr tablet Take 2,000 mg by mouth at bedtime. 1529m 04/04/19   [provider]  Methyl Salicylate 25 % CREA Apply topically. 01/09/21   [provider]  omeprazole (PRILOSEC) 40 MG capsule Take 40 mg by mouth every morning. 08/12/19   [provider]  OneTouch Delica Lancets 344RMISC See admin instructions. 06/06/17   [provider]  rosuvastatin (CRESTOR) 5 MG tablet 1 tablet 10/09/20   [provider]  Semaglutide, 1 MG/DOSE, (OZEMPIC, 1 MG/DOSE,) 4 MG/3ML SOPN 162m1/9/23   [provider]  Semaglutide, 2 MG/DOSE, (OZEMPIC, 2 MG/DOSE,) 8 MG/3ML SOPN Inject 2 milligrams under the skin once weekly. 10/12/52/00   TRULICITY 3 MGQQ/7.6PPOPN SMARTSIG:3 Milligram(s) SUB-Q Once a Week 01/14/21   [provider]  Vitamin D, Ergocalciferol, (DRISDOL) 1.25 MG (50000 UNIT) CAPS capsule Take 1 capsule (50,000 Units total) by mouth once a week. 10/30/21         Allergies    Sulfa antibiotics, Atorvastatin, and Empagliflozin    Review of Systems   Review of Systems Review of systems negative for LOC, lightheadedness.  A 10  point review of systems was performed and is negative unless otherwise reported in HPI.  Physical Exam Updated Vital Signs BP (!) 112/54   Pulse 91   Temp 98 F (36.7 C) (Oral)   Resp 19   LMP 02/11/2009 (Approximate)   SpO2 95%  Physical Exam General: Normal appearing female, lying in bed.  HEENT: Sclera anicteric, MMM, trachea midline. Cardiology: RRR, no murmurs/rubs/gallops. BL radial and DP pulses equal bilaterally.  Resp: Normal respiratory rate and effort. CTAB, no wheezes, rhonchi, crackles.   Abd: Soft, non-tender, non-distended. No rebound tenderness or guarding.  GU: Deferred. MSK: Extremities without deformity or TTP. Intact ROM. No cyanosis or clubbing. Skin: warm, dry. Small area of erythema, approximately 3x3 cm, to lateral left ankle. Mild edema and mild TTP. No crepitus.  Neuro: A&Ox4, CNs II-XII grossly intact. MAEs. Sensation grossly intact. Normal gait.  Psych: Normal mood and affect.     Media Information     ED Results / Procedures / Treatments   Labs (all labs ordered are listed, but only abnormal results are displayed) Labs Reviewed  CBC WITH DIFFERENTIAL/PLATELET - Abnormal; Notable for the following components:      Result Value   WBC 16.6 (*)    Platelets 437 (*)    Neutro Abs 12.0 (*)    All other components within normal limits  COMPREHENSIVE METABOLIC PANEL - Abnormal; Notable for the following components:   Glucose, Bld 152 (*)    All other components within normal limits  PROTIME-INR  APTT    EKG None  Radiology No results found.  Procedures Procedures    Medications Ordered in ED Medications - No data to display  ED Course/ Medical Decision Making/ A&P                          Medical Decision Making Amount and/or Complexity of Data Reviewed Labs: ordered.   Patient is very well-appearing and hemodynamically stable.  Concern for snake bite. Did not see the snake that caused it unfortunately. If it was a pit viper such as a copperhead, must consider coagulopathy or thrombocytopenia and will check labs. She has no systemic symptoms such as N/V, lightheadedness, and has been HDS. Compartment is soft at this time, no concern for compartment syndrome. Site is marked. Will elevate extremity and observe for 8 hours.  I have personally reviewed and interpreted all labs.   Clinical Course as of 12/16/21 2336  Nancy Fetter Dec 16, 2021  2026 Poison control contacted. Recommended observation x 8 hours after the bite, which is 12:30 AM. If  swelling is not progressing at 6 hours, do not need to repeat labs. Patient's leg is elevated and will measure circumference. If her erythema and swelling is progressing, will consider [employer]. Now, however, patient appears to so far be clinically improving.   Patient's labs demonstrate normal PTT, PT/INR, and her platelets are elevated to 437. Only abnormality is WBC 16.6. Will CTM. [HN]  2300 Patient reevaluated.  She feels well and has no complaints except for a mild feeling of tingling in her foot.  Patient's foot is reevaluated and there is almost no erythema or swelling anymore, there is no tenderness palpation and no observable changes to the skin.  The leg is elevated still.  We will continue to monitor patient until 12:30 AM as instructed by poison control but believe that patient will be likely safe to be discharged home.  Does not appear that she was  envenomated.  Patient is signed out to Dr. Leonette Monarch who is made aware of her history, presentation, exam, workup, and plan.   [HN]    Clinical Course User Index [HN] Audley Hose, MD          Final Clinical Impression(s) / ED Diagnoses Final diagnoses:  Snake bite, initial encounter    Rx / DC Orders ED Discharge Orders     None        This note was created using dictation software, which may contain spelling or grammatical errors.    Audley Hose, MD 12/16/21 [phone]

## 2021-12-16 NOTE — Discharge Instructions (Signed)
Thank you for coming to [facility] Emergency Department. You were seen for a possible snake bite. We did an exam, labs, and imaging, and these showed no acute findings. You were observed for 8 hours as poison control recommended. Please follow up with your primary care provider within 1 week.   Do not hesitate to return to the ED or call 911 if you experience: -Worsening symptoms -Increased swelling, redness of the right leg -Numbness tingling, discoloration, increased pain -Lightheadedness, passing out -Fevers/chills -Anything else that concerns you

## 2021-12-16 NOTE — ED Triage Notes (Signed)
Pt is here for what she thinks is a snake bite.  Pt has swelling to outside of left ankle.  Pt states that she felt like she was being struck by something and when she looked she had 2 bites. She did not see the snake that caused this. Swelling marked

## 2021-12-17 NOTE — ED Notes (Signed)
Guideline for monitoring of snake bite in leg/foot results: Foot: 1810  23cm. 1910  23cm. 2010  23cm. 2110  23cm.  0010  23cm Ankle: 1810  27.3cm. 1910 27.3cm.  2010 27.3cm. 2110  27.3cm. 0010  27.3cm Calf: 1810  36.5cm. 1910  36.5cm. 2010  36.5cm.  2110  36.5cm.  0010  36.5cm Thigh: 1810  45.8cm. 1910  45.8cm. 2010  45.8cm. 2110  45.8cm.  0010  45.8cm.  Pt denies any groin tenderness during ED visit

## 2021-12-27 ENCOUNTER — Other Ambulatory Visit (HOSPITAL_BASED_OUTPATIENT_CLINIC_OR_DEPARTMENT_OTHER): Payer: Self-pay

## 2021-12-27 MED ORDER — ROSUVASTATIN CALCIUM 5 MG PO TABS
5.0000 mg | ORAL_TABLET | Freq: Every day | ORAL | 5 refills | Status: AC
  Filled 2022-06-28: qty 30, 30d supply, fill #0
  Filled 2022-08-17: qty 30, 30d supply, fill #1

## 2021-12-27 MED ORDER — OZEMPIC (1 MG/DOSE) 4 MG/3ML ~~LOC~~ SOPN: 1.0000 mg | PEN_INJECTOR | SUBCUTANEOUS | 1 refills | Status: AC

## 2021-12-27 MED ORDER — COLCHICINE 0.6 MG PO TABS: ORAL_TABLET | ORAL | 1 refills | Status: AC

## 2021-12-28 ENCOUNTER — Other Ambulatory Visit (HOSPITAL_BASED_OUTPATIENT_CLINIC_OR_DEPARTMENT_OTHER): Payer: Self-pay

## 2021-12-28 MED ORDER — METFORMIN HCL ER 500 MG PO TB24
2000.0000 mg | ORAL_TABLET | Freq: Every day | ORAL | 3 refills | Status: AC
  Filled 2021-12-28: qty 360, 90d supply, fill #0

## 2022-01-02 ENCOUNTER — Other Ambulatory Visit (HOSPITAL_BASED_OUTPATIENT_CLINIC_OR_DEPARTMENT_OTHER): Payer: Self-pay

## 2022-01-02 MED ORDER — OMEPRAZOLE 40 MG PO CPDR
40.0000 mg | DELAYED_RELEASE_CAPSULE | ORAL | 1 refills | Status: AC
  Filled 2022-01-02: qty 90, 90d supply, fill #0

## 2022-01-02 MED ORDER — METFORMIN HCL ER 500 MG PO TB24
2000.0000 mg | ORAL_TABLET | Freq: Every evening | ORAL | 3 refills | Status: AC
  Filled 2022-01-02: qty 360, 90d supply, fill #0

## 2022-02-11 DIAGNOSIS — M26609 Unspecified temporomandibular joint disorder, unspecified side: Secondary | ICD-10-CM | POA: Diagnosis not present

## 2022-02-20 ENCOUNTER — Other Ambulatory Visit (HOSPITAL_BASED_OUTPATIENT_CLINIC_OR_DEPARTMENT_OTHER): Payer: Self-pay

## 2022-02-20 MED ORDER — MELOXICAM 15 MG PO TABS
15.0000 mg | ORAL_TABLET | Freq: Every day | ORAL | 1 refills | Status: AC
  Filled 2022-02-20: qty 30, 30d supply, fill #0
  Filled 2022-06-28: qty 30, 30d supply, fill #1

## 2022-02-22 ENCOUNTER — Other Ambulatory Visit (HOSPITAL_BASED_OUTPATIENT_CLINIC_OR_DEPARTMENT_OTHER): Payer: Self-pay

## 2022-03-04 DIAGNOSIS — K080 Exfoliation of teeth due to systemic causes: Secondary | ICD-10-CM | POA: Diagnosis not present

## 2022-03-15 ENCOUNTER — Other Ambulatory Visit (HOSPITAL_BASED_OUTPATIENT_CLINIC_OR_DEPARTMENT_OTHER): Payer: Self-pay

## 2022-04-24 DIAGNOSIS — K219 Gastro-esophageal reflux disease without esophagitis: Secondary | ICD-10-CM | POA: Diagnosis not present

## 2022-04-24 DIAGNOSIS — E1165 Type 2 diabetes mellitus with hyperglycemia: Secondary | ICD-10-CM | POA: Diagnosis not present

## 2022-04-24 DIAGNOSIS — E7800 Pure hypercholesterolemia, unspecified: Secondary | ICD-10-CM | POA: Diagnosis not present

## 2022-04-29 ENCOUNTER — Other Ambulatory Visit (HOSPITAL_BASED_OUTPATIENT_CLINIC_OR_DEPARTMENT_OTHER): Payer: Self-pay

## 2022-04-29 MED ORDER — METFORMIN HCL ER 500 MG PO TB24
2000.0000 mg | ORAL_TABLET | Freq: Every day | ORAL | 3 refills | Status: AC
  Filled 2022-04-29 – 2022-05-10 (×2): qty 360, 90d supply, fill #0
  Filled 2022-08-17: qty 360, 90d supply, fill #1

## 2022-05-06 ENCOUNTER — Other Ambulatory Visit (HOSPITAL_BASED_OUTPATIENT_CLINIC_OR_DEPARTMENT_OTHER): Payer: Self-pay

## 2022-05-09 ENCOUNTER — Other Ambulatory Visit (HOSPITAL_BASED_OUTPATIENT_CLINIC_OR_DEPARTMENT_OTHER): Payer: Self-pay

## 2022-05-09 MED ORDER — CYCLOBENZAPRINE HCL 5 MG PO TABS
5.0000 mg | ORAL_TABLET | Freq: Every day | ORAL | 0 refills | Status: AC
  Filled 2022-05-09: qty 60, 60d supply, fill #0

## 2022-05-09 MED ORDER — MELOXICAM 15 MG PO TABS
15.0000 mg | ORAL_TABLET | Freq: Every day | ORAL | 0 refills | Status: AC
  Filled 2022-05-09: qty 30, 30d supply, fill #0

## 2022-05-10 ENCOUNTER — Other Ambulatory Visit (HOSPITAL_BASED_OUTPATIENT_CLINIC_OR_DEPARTMENT_OTHER): Payer: Self-pay

## 2022-05-24 ENCOUNTER — Other Ambulatory Visit (HOSPITAL_BASED_OUTPATIENT_CLINIC_OR_DEPARTMENT_OTHER): Payer: Self-pay

## 2022-05-24 DIAGNOSIS — J3489 Other specified disorders of nose and nasal sinuses: Secondary | ICD-10-CM | POA: Diagnosis not present

## 2022-05-24 MED ORDER — DOXYCYCLINE HYCLATE 100 MG PO TABS
ORAL_TABLET | ORAL | 0 refills | Status: AC
  Filled 2022-05-24: qty 14, 7d supply, fill #0

## 2022-06-28 ENCOUNTER — Other Ambulatory Visit (HOSPITAL_BASED_OUTPATIENT_CLINIC_OR_DEPARTMENT_OTHER): Payer: Self-pay

## 2022-07-01 ENCOUNTER — Other Ambulatory Visit (HOSPITAL_BASED_OUTPATIENT_CLINIC_OR_DEPARTMENT_OTHER): Payer: Self-pay

## 2022-07-01 DIAGNOSIS — E119 Type 2 diabetes mellitus without complications: Secondary | ICD-10-CM | POA: Diagnosis not present

## 2022-07-01 DIAGNOSIS — Z7984 Long term (current) use of oral hypoglycemic drugs: Secondary | ICD-10-CM | POA: Diagnosis not present

## 2022-07-01 DIAGNOSIS — Z7985 Long-term (current) use of injectable non-insulin antidiabetic drugs: Secondary | ICD-10-CM | POA: Diagnosis not present

## 2022-07-01 MED ORDER — OZEMPIC (1 MG/DOSE) 4 MG/3ML ~~LOC~~ SOPN
2.0000 mg | PEN_INJECTOR | SUBCUTANEOUS | 3 refills | Status: AC
  Filled 2022-07-01: qty 3, 28d supply, fill #0

## 2022-07-03 ENCOUNTER — Other Ambulatory Visit (HOSPITAL_BASED_OUTPATIENT_CLINIC_OR_DEPARTMENT_OTHER): Payer: Self-pay

## 2022-07-03 ENCOUNTER — Other Ambulatory Visit: Payer: Self-pay

## 2022-07-03 MED ORDER — DAPAGLIFLOZIN PROPANEDIOL 10 MG PO TABS
10.0000 mg | ORAL_TABLET | Freq: Every day | ORAL | 5 refills | Status: AC
  Filled 2022-07-03: qty 30, 30d supply, fill #0

## 2022-07-03 MED ORDER — OZEMPIC (2 MG/DOSE) 8 MG/3ML ~~LOC~~ SOPN
2.0000 mg | PEN_INJECTOR | SUBCUTANEOUS | 5 refills | Status: AC
  Filled 2022-07-03: qty 9, 84d supply, fill #0
  Filled 2022-07-03: qty 3, 28d supply, fill #0
  Filled 2022-07-03: qty 3, 84d supply, fill #0
  Filled 2022-10-01: qty 9, 84d supply, fill #1

## 2022-07-03 MED ORDER — OZEMPIC (2 MG/DOSE) 8 MG/3ML ~~LOC~~ SOPN
2.0000 mg | PEN_INJECTOR | SUBCUTANEOUS | 5 refills | Status: AC
  Filled 2022-07-03 – 2022-10-01 (×2): qty 3, 28d supply, fill #0

## 2022-07-03 MED ORDER — DAPAGLIFLOZIN PROPANEDIOL 10 MG PO TABS
10.0000 mg | ORAL_TABLET | Freq: Every day | ORAL | 3 refills | Status: AC
  Filled 2022-07-03: qty 90, 90d supply, fill #0

## 2022-07-15 ENCOUNTER — Other Ambulatory Visit (HOSPITAL_BASED_OUTPATIENT_CLINIC_OR_DEPARTMENT_OTHER): Payer: Self-pay

## 2022-07-15 MED ORDER — METFORMIN HCL ER (MOD) 500 MG PO TB24
2000.0000 mg | ORAL_TABLET | Freq: Every day | ORAL | 0 refills | Status: AC
  Filled 2022-07-15: qty 90, 22d supply, fill #0

## 2022-07-25 DIAGNOSIS — L739 Follicular disorder, unspecified: Secondary | ICD-10-CM | POA: Diagnosis not present

## 2022-07-25 DIAGNOSIS — L0201 Cutaneous abscess of face: Secondary | ICD-10-CM | POA: Diagnosis not present

## 2022-07-25 DIAGNOSIS — Z8614 Personal history of Methicillin resistant Staphylococcus aureus infection: Secondary | ICD-10-CM | POA: Diagnosis not present

## 2022-08-17 ENCOUNTER — Other Ambulatory Visit (HOSPITAL_BASED_OUTPATIENT_CLINIC_OR_DEPARTMENT_OTHER): Payer: Self-pay

## 2022-10-01 ENCOUNTER — Other Ambulatory Visit (HOSPITAL_BASED_OUTPATIENT_CLINIC_OR_DEPARTMENT_OTHER): Payer: Self-pay

## 2022-10-02 ENCOUNTER — Other Ambulatory Visit (HOSPITAL_BASED_OUTPATIENT_CLINIC_OR_DEPARTMENT_OTHER): Payer: Self-pay

## 2022-11-06 DIAGNOSIS — Z23 Encounter for immunization: Secondary | ICD-10-CM | POA: Diagnosis not present

## 2022-11-07 ENCOUNTER — Other Ambulatory Visit: Payer: Self-pay | Admitting: Family Medicine

## 2022-11-07 DIAGNOSIS — Z1231 Encounter for screening mammogram for malignant neoplasm of breast: Secondary | ICD-10-CM

## 2022-11-08 ENCOUNTER — Other Ambulatory Visit: Payer: Self-pay | Admitting: Family Medicine

## 2022-11-08 DIAGNOSIS — Z1231 Encounter for screening mammogram for malignant neoplasm of breast: Secondary | ICD-10-CM

## 2022-11-12 DIAGNOSIS — E559 Vitamin D deficiency, unspecified: Secondary | ICD-10-CM | POA: Diagnosis not present

## 2022-11-12 DIAGNOSIS — Z0000 Encounter for general adult medical examination without abnormal findings: Secondary | ICD-10-CM | POA: Diagnosis not present

## 2022-11-12 DIAGNOSIS — E1165 Type 2 diabetes mellitus with hyperglycemia: Secondary | ICD-10-CM | POA: Diagnosis not present

## 2022-11-12 DIAGNOSIS — N3281 Overactive bladder: Secondary | ICD-10-CM | POA: Diagnosis not present

## 2022-11-12 DIAGNOSIS — E7800 Pure hypercholesterolemia, unspecified: Secondary | ICD-10-CM | POA: Diagnosis not present

## 2022-11-15 DIAGNOSIS — R3129 Other microscopic hematuria: Secondary | ICD-10-CM | POA: Diagnosis not present

## 2022-11-18 DIAGNOSIS — Z01419 Encounter for gynecological examination (general) (routine) without abnormal findings: Secondary | ICD-10-CM | POA: Diagnosis not present

## 2022-11-18 DIAGNOSIS — N898 Other specified noninflammatory disorders of vagina: Secondary | ICD-10-CM | POA: Diagnosis not present

## 2022-11-18 DIAGNOSIS — Z124 Encounter for screening for malignant neoplasm of cervix: Secondary | ICD-10-CM | POA: Diagnosis not present

## 2022-12-04 DIAGNOSIS — R3121 Asymptomatic microscopic hematuria: Secondary | ICD-10-CM | POA: Diagnosis not present

## 2022-12-04 DIAGNOSIS — R8271 Bacteriuria: Secondary | ICD-10-CM | POA: Diagnosis not present

## 2022-12-04 DIAGNOSIS — R1084 Generalized abdominal pain: Secondary | ICD-10-CM | POA: Diagnosis not present

## 2022-12-04 DIAGNOSIS — N3941 Urge incontinence: Secondary | ICD-10-CM | POA: Diagnosis not present

## 2022-12-04 DIAGNOSIS — N3020 Other chronic cystitis without hematuria: Secondary | ICD-10-CM | POA: Diagnosis not present

## 2022-12-23 ENCOUNTER — Ambulatory Visit
Admission: RE | Admit: 2022-12-23 | Discharge: 2022-12-23 | Disposition: A | Discharge status: Home / Self Care | Payer: Federal, State, Local not specified - PPO | Source: Ambulatory Visit | Attending: Family Medicine | Admitting: Family Medicine

## 2022-12-23 DIAGNOSIS — Z1231 Encounter for screening mammogram for malignant neoplasm of breast: Secondary | ICD-10-CM | POA: Diagnosis not present

## 2023-01-01 ENCOUNTER — Other Ambulatory Visit (HOSPITAL_BASED_OUTPATIENT_CLINIC_OR_DEPARTMENT_OTHER): Payer: Self-pay

## 2023-01-01 DIAGNOSIS — R109 Unspecified abdominal pain: Secondary | ICD-10-CM | POA: Diagnosis not present

## 2023-01-01 DIAGNOSIS — K5730 Diverticulosis of large intestine without perforation or abscess without bleeding: Secondary | ICD-10-CM | POA: Diagnosis not present

## 2023-01-01 DIAGNOSIS — R3121 Asymptomatic microscopic hematuria: Secondary | ICD-10-CM | POA: Diagnosis not present

## 2023-01-01 DIAGNOSIS — N200 Calculus of kidney: Secondary | ICD-10-CM | POA: Diagnosis not present

## 2023-01-01 DIAGNOSIS — D259 Leiomyoma of uterus, unspecified: Secondary | ICD-10-CM | POA: Diagnosis not present

## 2023-01-01 MED ORDER — METFORMIN HCL ER 500 MG PO TB24
2000.0000 mg | ORAL_TABLET | Freq: Every day | ORAL | 1 refills | Status: DC
  Filled 2023-01-01: qty 360, 90d supply, fill #0
  Filled 2023-04-01: qty 360, 90d supply, fill #1

## 2023-01-01 MED ORDER — OMEPRAZOLE 40 MG PO CPDR
40.0000 mg | DELAYED_RELEASE_CAPSULE | Freq: Every day | ORAL | 0 refills | Status: DC
  Filled 2023-01-01: qty 90, 90d supply, fill #0

## 2023-01-01 MED ORDER — DAPAGLIFLOZIN PROPANEDIOL 10 MG PO TABS: 10.0000 mg | ORAL_TABLET | Freq: Every day | ORAL | 2 refills | Status: AC

## 2023-01-01 MED ORDER — ROSUVASTATIN CALCIUM 5 MG PO TABS
5.0000 mg | ORAL_TABLET | Freq: Every day | ORAL | 5 refills | Status: DC
  Filled 2023-01-01: qty 30, 30d supply, fill #0
  Filled 2023-04-01: qty 30, 30d supply, fill #1
  Filled 2023-04-03: qty 90, 90d supply, fill #1
  Filled 2023-06-25: qty 60, 60d supply, fill #2

## 2023-01-01 MED ORDER — OZEMPIC (2 MG/DOSE) 8 MG/3ML ~~LOC~~ SOPN
2.0000 mg | PEN_INJECTOR | SUBCUTANEOUS | 3 refills | Status: AC
  Filled 2023-01-01: qty 9, 84d supply, fill #0

## 2023-01-01 MED ORDER — DAPAGLIFLOZIN PROPANEDIOL 10 MG PO TABS: 10.0000 mg | ORAL_TABLET | Freq: Every day | ORAL | 5 refills | Status: AC

## 2023-01-01 MED ORDER — OZEMPIC (2 MG/DOSE) 8 MG/3ML ~~LOC~~ SOPN
2.0000 mg | PEN_INJECTOR | SUBCUTANEOUS | 5 refills | Status: AC
  Filled 2023-01-01: qty 3, 28d supply, fill #0
  Filled 2023-04-01: qty 9, 84d supply, fill #0

## 2023-01-04 ENCOUNTER — Other Ambulatory Visit (HOSPITAL_BASED_OUTPATIENT_CLINIC_OR_DEPARTMENT_OTHER): Payer: Self-pay

## 2023-01-06 ENCOUNTER — Other Ambulatory Visit (HOSPITAL_BASED_OUTPATIENT_CLINIC_OR_DEPARTMENT_OTHER): Payer: Self-pay

## 2023-01-27 DIAGNOSIS — R3121 Asymptomatic microscopic hematuria: Secondary | ICD-10-CM | POA: Diagnosis not present

## 2023-01-27 DIAGNOSIS — N390 Urinary tract infection, site not specified: Secondary | ICD-10-CM | POA: Diagnosis not present

## 2023-01-27 DIAGNOSIS — B9620 Unspecified Escherichia coli [E. coli] as the cause of diseases classified elsewhere: Secondary | ICD-10-CM | POA: Diagnosis not present

## 2023-01-27 DIAGNOSIS — N200 Calculus of kidney: Secondary | ICD-10-CM | POA: Diagnosis not present

## 2023-01-30 ENCOUNTER — Other Ambulatory Visit (HOSPITAL_BASED_OUTPATIENT_CLINIC_OR_DEPARTMENT_OTHER): Payer: Self-pay

## 2023-01-30 MED ORDER — LEVOFLOXACIN 500 MG PO TABS
500.0000 mg | ORAL_TABLET | Freq: Every day | ORAL | 0 refills | Status: AC
  Filled 2023-01-30: qty 7, 7d supply, fill #0

## 2023-01-31 ENCOUNTER — Other Ambulatory Visit (HOSPITAL_BASED_OUTPATIENT_CLINIC_OR_DEPARTMENT_OTHER): Payer: Self-pay

## 2023-04-01 ENCOUNTER — Other Ambulatory Visit (HOSPITAL_BASED_OUTPATIENT_CLINIC_OR_DEPARTMENT_OTHER): Payer: Self-pay

## 2023-04-01 MED ORDER — OMEPRAZOLE 40 MG PO CPDR
40.0000 mg | DELAYED_RELEASE_CAPSULE | Freq: Every day | ORAL | 0 refills | Status: DC
  Filled 2023-04-01: qty 90, 90d supply, fill #0

## 2023-04-02 DIAGNOSIS — H2513 Age-related nuclear cataract, bilateral: Secondary | ICD-10-CM | POA: Diagnosis not present

## 2023-04-02 DIAGNOSIS — H524 Presbyopia: Secondary | ICD-10-CM | POA: Diagnosis not present

## 2023-04-02 DIAGNOSIS — H52203 Unspecified astigmatism, bilateral: Secondary | ICD-10-CM | POA: Diagnosis not present

## 2023-04-02 DIAGNOSIS — E119 Type 2 diabetes mellitus without complications: Secondary | ICD-10-CM | POA: Diagnosis not present

## 2023-04-03 ENCOUNTER — Other Ambulatory Visit (HOSPITAL_BASED_OUTPATIENT_CLINIC_OR_DEPARTMENT_OTHER): Payer: Self-pay

## 2023-04-17 ENCOUNTER — Other Ambulatory Visit (HOSPITAL_BASED_OUTPATIENT_CLINIC_OR_DEPARTMENT_OTHER): Payer: Self-pay

## 2023-04-17 DIAGNOSIS — Z7984 Long term (current) use of oral hypoglycemic drugs: Secondary | ICD-10-CM | POA: Diagnosis not present

## 2023-04-17 DIAGNOSIS — E1165 Type 2 diabetes mellitus with hyperglycemia: Secondary | ICD-10-CM | POA: Diagnosis not present

## 2023-04-17 DIAGNOSIS — Z7985 Long-term (current) use of injectable non-insulin antidiabetic drugs: Secondary | ICD-10-CM | POA: Diagnosis not present

## 2023-04-17 MED ORDER — OZEMPIC (2 MG/DOSE) 8 MG/3ML ~~LOC~~ SOPN
2.0000 mg | PEN_INJECTOR | SUBCUTANEOUS | 1 refills | Status: DC
  Filled 2023-04-17 – 2023-06-25 (×2): qty 9, 84d supply, fill #0
  Filled 2023-09-23: qty 9, 84d supply, fill #1

## 2023-04-17 MED ORDER — METFORMIN HCL ER 500 MG PO TB24
1000.0000 mg | ORAL_TABLET | Freq: Every day | ORAL | 1 refills | Status: DC
  Filled 2023-04-17 – 2023-06-25 (×2): qty 180, 90d supply, fill #0
  Filled 2023-09-23: qty 180, 90d supply, fill #1

## 2023-04-30 ENCOUNTER — Other Ambulatory Visit (HOSPITAL_COMMUNITY): Payer: Self-pay

## 2023-04-30 ENCOUNTER — Other Ambulatory Visit (HOSPITAL_BASED_OUTPATIENT_CLINIC_OR_DEPARTMENT_OTHER): Payer: Self-pay

## 2023-06-15 DIAGNOSIS — R399 Unspecified symptoms and signs involving the genitourinary system: Secondary | ICD-10-CM | POA: Diagnosis not present

## 2023-06-15 DIAGNOSIS — R319 Hematuria, unspecified: Secondary | ICD-10-CM | POA: Diagnosis not present

## 2023-06-15 DIAGNOSIS — W57XXXA Bitten or stung by nonvenomous insect and other nonvenomous arthropods, initial encounter: Secondary | ICD-10-CM | POA: Diagnosis not present

## 2023-06-15 DIAGNOSIS — S80862A Insect bite (nonvenomous), left lower leg, initial encounter: Secondary | ICD-10-CM | POA: Diagnosis not present

## 2023-06-25 ENCOUNTER — Other Ambulatory Visit (HOSPITAL_BASED_OUTPATIENT_CLINIC_OR_DEPARTMENT_OTHER): Payer: Self-pay

## 2023-06-25 MED ORDER — OMEPRAZOLE 40 MG PO CPDR
DELAYED_RELEASE_CAPSULE | ORAL | 1 refills | Status: DC
  Filled 2023-06-25: qty 90, 90d supply, fill #0
  Filled 2023-09-23: qty 90, 90d supply, fill #1

## 2023-06-26 ENCOUNTER — Other Ambulatory Visit (HOSPITAL_BASED_OUTPATIENT_CLINIC_OR_DEPARTMENT_OTHER): Payer: Self-pay

## 2023-07-22 DIAGNOSIS — E1165 Type 2 diabetes mellitus with hyperglycemia: Secondary | ICD-10-CM | POA: Diagnosis not present

## 2023-09-22 ENCOUNTER — Other Ambulatory Visit (HOSPITAL_BASED_OUTPATIENT_CLINIC_OR_DEPARTMENT_OTHER): Payer: Self-pay

## 2023-09-22 DIAGNOSIS — N200 Calculus of kidney: Secondary | ICD-10-CM | POA: Diagnosis not present

## 2023-09-22 DIAGNOSIS — R399 Unspecified symptoms and signs involving the genitourinary system: Secondary | ICD-10-CM | POA: Diagnosis not present

## 2023-09-22 DIAGNOSIS — R3129 Other microscopic hematuria: Secondary | ICD-10-CM | POA: Diagnosis not present

## 2023-09-22 MED ORDER — TAMSULOSIN HCL 0.4 MG PO CAPS
0.4000 mg | ORAL_CAPSULE | Freq: Every day | ORAL | 0 refills | Status: AC
  Filled 2023-09-22: qty 30, 30d supply, fill #0

## 2023-09-23 ENCOUNTER — Other Ambulatory Visit (HOSPITAL_BASED_OUTPATIENT_CLINIC_OR_DEPARTMENT_OTHER): Payer: Self-pay

## 2023-09-30 ENCOUNTER — Other Ambulatory Visit (HOSPITAL_BASED_OUTPATIENT_CLINIC_OR_DEPARTMENT_OTHER): Payer: Self-pay

## 2023-09-30 DIAGNOSIS — R3982 Chronic bladder pain: Secondary | ICD-10-CM | POA: Diagnosis not present

## 2023-09-30 DIAGNOSIS — R3121 Asymptomatic microscopic hematuria: Secondary | ICD-10-CM | POA: Diagnosis not present

## 2023-09-30 MED ORDER — AMOXICILLIN-POT CLAVULANATE 500-125 MG PO TABS
1.0000 | ORAL_TABLET | Freq: Two times a day (BID) | ORAL | 0 refills | Status: AC
  Filled 2023-09-30: qty 14, 7d supply, fill #0

## 2023-10-06 DIAGNOSIS — E782 Mixed hyperlipidemia: Secondary | ICD-10-CM | POA: Diagnosis not present

## 2023-10-06 DIAGNOSIS — E1165 Type 2 diabetes mellitus with hyperglycemia: Secondary | ICD-10-CM | POA: Diagnosis not present

## 2023-10-06 DIAGNOSIS — I10 Essential (primary) hypertension: Secondary | ICD-10-CM | POA: Diagnosis not present

## 2023-10-16 ENCOUNTER — Other Ambulatory Visit (HOSPITAL_BASED_OUTPATIENT_CLINIC_OR_DEPARTMENT_OTHER): Payer: Self-pay

## 2023-10-16 ENCOUNTER — Emergency Department (HOSPITAL_COMMUNITY)
Admission: EM | Admit: 2023-10-16 | Discharge: 2023-10-16 | Disposition: A | Discharge status: Home / Self Care | Attending: Emergency Medicine | Admitting: Emergency Medicine

## 2023-10-16 ENCOUNTER — Emergency Department (HOSPITAL_COMMUNITY)

## 2023-10-16 ENCOUNTER — Other Ambulatory Visit: Payer: Self-pay

## 2023-10-16 DIAGNOSIS — R109 Unspecified abdominal pain: Secondary | ICD-10-CM | POA: Diagnosis not present

## 2023-10-16 DIAGNOSIS — K760 Fatty (change of) liver, not elsewhere classified: Secondary | ICD-10-CM | POA: Diagnosis not present

## 2023-10-16 DIAGNOSIS — M545 Low back pain, unspecified: Secondary | ICD-10-CM | POA: Diagnosis not present

## 2023-10-16 DIAGNOSIS — Z7984 Long term (current) use of oral hypoglycemic drugs: Secondary | ICD-10-CM | POA: Diagnosis not present

## 2023-10-16 DIAGNOSIS — K5730 Diverticulosis of large intestine without perforation or abscess without bleeding: Secondary | ICD-10-CM | POA: Diagnosis not present

## 2023-10-16 DIAGNOSIS — M549 Dorsalgia, unspecified: Secondary | ICD-10-CM | POA: Diagnosis not present

## 2023-10-16 DIAGNOSIS — R262 Difficulty in walking, not elsewhere classified: Secondary | ICD-10-CM | POA: Diagnosis not present

## 2023-10-16 LAB — URINALYSIS, ROUTINE W REFLEX MICROSCOPIC
Bilirubin Urine: NEGATIVE
Glucose, UA: NEGATIVE mg/dL
Hgb urine dipstick: NEGATIVE
Ketones, ur: NEGATIVE mg/dL
Leukocytes,Ua: NEGATIVE
Nitrite: NEGATIVE
Protein, ur: NEGATIVE mg/dL
Specific Gravity, Urine: 1.012 (ref 1.005–1.030)
pH: 7 (ref 5.0–8.0)

## 2023-10-16 MED ORDER — IBUPROFEN 400 MG PO TABS
400.0000 mg | ORAL_TABLET | Freq: Once | ORAL | Status: AC
  Administered 2023-10-16: 400 mg via ORAL
  Filled 2023-10-16: qty 1

## 2023-10-16 MED ORDER — TRAMADOL HCL 50 MG PO TABS
50.0000 mg | ORAL_TABLET | Freq: Four times a day (QID) | ORAL | 0 refills | Status: AC | PRN
  Filled 2023-10-16: qty 20, 5d supply, fill #0

## 2023-10-16 MED ORDER — METHOCARBAMOL 750 MG PO TABS
750.0000 mg | ORAL_TABLET | Freq: Three times a day (TID) | ORAL | 0 refills | Status: DC | PRN
  Filled 2023-10-16: qty 15, 5d supply, fill #0

## 2023-10-16 MED ORDER — OXYCODONE-ACETAMINOPHEN 5-325 MG PO TABS
2.0000 | ORAL_TABLET | Freq: Once | ORAL | Status: AC
  Administered 2023-10-16: 2 via ORAL
  Filled 2023-10-16: qty 2

## 2023-10-16 NOTE — ED Triage Notes (Signed)
 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 States pain constant since onset. Recent extensive workup and follow up with urologist for kidney stone and has been taking Flomax  and abx which she finished this morning.

## 2023-10-16 NOTE — ED Provider Notes (Signed)
 [facility] EMERGENCY DEPARTMENT AT [facility] Provider Note   CSN: [id number] Arrival date & time: 10/16/23  1044     Patient presents with: No chief complaint on file.   Sheri Lara is a 58 y.o. female.   Pt w remote hx kidney stone, c/o acute onset left flank/back pain today. 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 Denies specific injury or strain. Indicates feels similar to kidney stone pain from many years ago. Pain left flank/back posteriorly, non radiating. Pain is worse w certain movements and positional changes. No vomiting. No gross hematuria. No fever or chills. Indicates in past couple weeks was treated for possible uti, was given rx flomax  and antibiotic.   The history is provided by the patient and medical records.       Prior to Admission medications   Medication Sig Start Date End Date Taking? Authorizing Provider  albuterol  (PROAIR  HFA) 108 (90 Base) MCG/ACT inhaler Inhale 2 puffs into the lungs every 4 (four) hours. 10/24/21 11/23/21    amoxicillin -clavulanate (AUGMENTIN ) 500-125 MG tablet Take 1 tablet by mouth 2 (two) times daily. 09/30/23     atorvastatin (LIPITOR) 10 MG tablet Take 10 mg by mouth daily.  09/21/19   [provider]  Blood Glucose Monitoring Suppl (ONETOUCH VERIO FLEX SYSTEM) w/Device KIT See admin instructions. 06/06/17   [provider]  budesonide-formoterol (SYMBICORT) 160-4.5 MCG/ACT inhaler Symbicort 160 mcg-4.5 mcg/actuation HFA aerosol inhaler  TAKE 2 PUFFS BY MOUTH TWICE A DAY    [provider]  calcium  carbonate (OS-CAL) 1250 (500 Ca) MG chewable tablet Chew by mouth.    [provider]  cetirizine (ZYRTEC) 10 MG tablet 1 tablet    [provider]  colchicine  0.6 MG tablet Take 2 tablets by mouth for 1 dose, followed by 1 tablet one hour later as needed for acute attack. 11/11/21     cyclobenzaprine  (FLEXERIL ) 5 MG tablet Take 5 mg by mouth at bedtime. 01/09/21   [provider]  cyclobenzaprine  (FLEXERIL ) 5 MG tablet Take 1 tablet by mouth at bedtime 05/09/22     dapagliflozin  propanediol (FARXIGA ) 10 MG TABS tablet Take 1 tablet (10 mg total) by mouth daily. 07/03/22     dapagliflozin  propanediol (FARXIGA ) 10 MG TABS tablet Take 1 tablet (10 mg total) by mouth daily. 07/03/22     dapagliflozin  propanediol (FARXIGA ) 10 MG TABS tablet Take 1 tablet (10 mg total) by mouth daily. 07/03/22     dapagliflozin  propanediol (FARXIGA ) 10 MG TABS tablet Take 1 tablet (10 mg total) by mouth daily. 07/03/22     doxycycline  (VIBRA -TABS) 100 MG tablet Take 1 tablet (100 mg total) by mouth 2 (two) times a day for 7 days. Take with 8 oz water. Do not lie down for at least 30 minutes after. 05/24/22     ergocalciferol  (VITAMIN D2) 1.25 MG (50000 UT) capsule Take 1 capsule by mouth once a week. 04/07/14   [provider]  glucose blood (ONETOUCH VERIO) test strip See admin instructions. 06/06/17   [provider]  ibuprofen  (ADVIL ) 600 MG tablet Take 600 mg by mouth 3 (three) times daily as needed. 12/04/20   [provider]  levofloxacin  (LEVAQUIN ) 500 MG tablet Take 1 tablet (500 mg total) by mouth daily. 01/30/23     meloxicam  (MOBIC ) 15 MG tablet Take 1 tablet by mouth once a day 02/20/22     meloxicam  (MOBIC ) 15 MG tablet Take 1 tablet by mouth once a  day 05/09/22     metFORMIN  (GLUCOPHAGE -XR) 500 MG 24 hr tablet Take 2,000 mg by mouth at bedtime. 1500mg  04/04/19   [provider]  metFORMIN  (GLUCOPHAGE -XR) 500 MG 24 hr tablet Take 4 tablets (2,000 mg total) by mouth daily with evening meal. 12/28/21     metFORMIN  (GLUCOPHAGE -XR) 500 MG 24 hr tablet Take 4 tablets (2,000 mg total) by mouth every evening with a meal. 01/02/22     metFORMIN  (GLUCOPHAGE -XR) 500 MG 24 hr tablet Take 4 tablets (2,000 mg total) by mouth daily with supper. 04/29/22     metFORMIN  (GLUCOPHAGE -XR) 500 MG 24 hr tablet Take 2 tablets (1,000 mg total) by mouth daily. 04/17/23      metFORMIN  (GLUMETZA ) 500 MG (MOD) 24 hr tablet Take 4 tablets (2,000 mg total) by mouth daily. 07/03/22     Methyl Salicylate 25 % CREA Apply topically. 01/09/21   [provider]  omeprazole  (PRILOSEC) 40 MG capsule Take 40 mg by mouth every morning. 08/12/19   [provider]  omeprazole  (PRILOSEC) 40 MG capsule Take 1 capsule (40 mg total) by mouth daily 30 minutes before morning meal. 01/02/22     omeprazole  (PRILOSEC) 40 MG capsule Take 1 capsule (40 mg total) by mouth 30 minutes before morning meal. 06/25/23     OneTouch Delica Lancets 33G MISC See admin instructions. 06/06/17   [provider]  OZEMPIC , 2 MG/DOSE, 8 MG/3ML SOPN Inject 2 mg into the skin once a week. 07/03/22     OZEMPIC , 2 MG/DOSE, 8 MG/3ML SOPN Inject 2 mg into the skin once a week. 07/03/22     OZEMPIC , 2 MG/DOSE, 8 MG/3ML SOPN Inject 2 mg into the skin every 7 (seven) days. 04/17/23     rosuvastatin  (CRESTOR ) 5 MG tablet 1 tablet 10/09/20   [provider]  rosuvastatin  (CRESTOR ) 5 MG tablet Take 1 tablet (5 mg total) by mouth daily. 09/13/21     rosuvastatin  (CRESTOR ) 5 MG tablet Take 1 tablet (5 mg total) by mouth daily. 12/06/22   Koirala, Dibas, MD  Semaglutide , 1 MG/DOSE, (OZEMPIC , 1 MG/DOSE,) 4 MG/3ML SOPN 1mg  02/19/21   [provider]  Semaglutide , 1 MG/DOSE, (OZEMPIC , 1 MG/DOSE,) 4 MG/3ML SOPN Inject 1 mg into the skin once a week. 12/12/21     Semaglutide , 1 MG/DOSE, (OZEMPIC , 1 MG/DOSE,) 4 MG/3ML SOPN Inject 2 mg into the skin once a week. 07/01/22     Semaglutide , 2 MG/DOSE, (OZEMPIC , 2 MG/DOSE,) 8 MG/3ML SOPN Inject 2 mg into the skin once a week. 07/03/22     Semaglutide , 2 MG/DOSE, (OZEMPIC , 2 MG/DOSE,) 8 MG/3ML SOPN Inject 2 mg into the skin once a week. 07/03/22     tamsulosin  (FLOMAX ) 0.4 MG CAPS capsule Take 1 capsule (0.4 mg total) by mouth daily. 09/22/23     TRULICITY 3 MG/0.5ML SOPN SMARTSIG:3 Milligram(s) SUB-Q Once a Week 01/14/21   [provider]  Vitamin D ,  Ergocalciferol , (DRISDOL ) 1.25 MG (50000 UNIT) CAPS capsule Take 1 capsule (50,000 Units total) by mouth once a week. 10/30/21       Allergies: Sulfa antibiotics, Atorvastatin, and Empagliflozin    Review of Systems  Constitutional:  Negative for chills and fever.  Respiratory:  Negative for cough and shortness of breath.   Cardiovascular:  Negative for chest pain.  Gastrointestinal:  Negative for abdominal pain, nausea and vomiting.  Genitourinary:  Positive for flank pain. Negative for dysuria and hematuria.  Musculoskeletal:  Positive for back pain. Negative for neck pain.  Skin:  Negative for rash.  Neurological:  Negative for weakness, numbness and headaches.    Updated Vital Signs BP 122/74   Pulse 86   Temp 98.1 F (36.7 C) (Oral)   Resp 16   LMP 02/11/2009 (Approximate)   SpO2 99%   Physical Exam Vitals and nursing note reviewed.  Constitutional:      Appearance: Normal appearance. She is well-developed.  HENT:     Head: Atraumatic.     Nose: Nose normal.     Mouth/Throat:     Mouth: Mucous membranes are moist.  Eyes:     General: No scleral icterus.    Conjunctiva/sclera: Conjunctivae normal.  Neck:     Trachea: No tracheal deviation.  Cardiovascular:     Rate and Rhythm: Normal rate.     Pulses: Normal pulses.  Pulmonary:     Effort: Pulmonary effort is normal. No respiratory distress.  Abdominal:     General: Bowel sounds are normal. There is no distension.     Palpations: Abdomen is soft. There is no mass.     Tenderness: There is no abdominal tenderness. There is no guarding or rebound.     Hernia: No hernia is present.  Genitourinary:    Comments: No cva tenderness.  Musculoskeletal:        General: No swelling.     Cervical back: Neck supple. No muscular tenderness.     Comments: T/L/S spine non tender, aligned. Left lumbar muscular tenderness. No sts. No skin lesions/rash in area of pain.   Skin:    General: Skin is warm and dry.     Findings:  No rash.  Neurological:     Mental Status: She is alert.     Comments: Alert, speech normal. Motor/sens grossly intact bil. Lower ext stre 5/5. Sens grossly intact.   Psychiatric:        Mood and Affect: Mood normal.     (all labs ordered are listed, but only abnormal results are displayed) Results for orders placed or performed during the hospital encounter of 10/16/23  Urinalysis, Routine w reflex microscopic -Urine, Clean Catch   Collection Time: 10/16/23 12:00 PM  Result Value Ref Range   Color, Urine YELLOW YELLOW   APPearance CLEAR CLEAR   Specific Gravity, Urine 1.012 1.005 - 1.030   pH 7.0 5.0 - 8.0   Glucose, UA NEGATIVE NEGATIVE mg/dL   Hgb urine dipstick NEGATIVE NEGATIVE   Bilirubin Urine NEGATIVE NEGATIVE   Ketones, ur NEGATIVE NEGATIVE mg/dL   Protein, ur NEGATIVE NEGATIVE mg/dL   Nitrite NEGATIVE NEGATIVE   Leukocytes,Ua NEGATIVE NEGATIVE   CT Renal Stone Study Result Date: 10/16/2023 CLINICAL DATA:  Left flank pain EXAM: CT ABDOMEN AND PELVIS WITHOUT CONTRAST TECHNIQUE: Multidetector CT imaging of the abdomen and pelvis was performed following the standard protocol without IV contrast. RADIATION DOSE REDUCTION: This exam was performed according to the departmental dose-optimization program which includes automated exposure control, adjustment of the mA and/or kV according to patient size and/or use of iterative reconstruction technique. COMPARISON:  01/01/2023 FINDINGS: Lower chest: No acute findings Hepatobiliary: Diffuse low-density throughout the liver compatible with fatty infiltration. No focal abnormality. Gallbladder unremarkable. Pancreas: No focal abnormality or ductal dilatation. Spleen: No focal abnormality.  Normal size. Adrenals/Urinary Tract: Punctate left midpole nephrolithiasis. No ureteral stones or hydronephrosis. No renal or adrenal abnormality. Urinary bladder unremarkable. Stomach/Bowel: Colonic diverticulosis most notable in the right colon and sigmoid  colon. No active diverticulitis. Normal appendix. Stomach and small bowel  decompressed. No bowel obstruction or inflammatory process. Vascular/Lymphatic: Scattered aortic atherosclerosis. No aneurysm or dissection. Reproductive: Uterus and adnexa unremarkable.  No mass. Other: No free fluid or free air. Musculoskeletal: No acute bony abnormality. IMPRESSION: Left punctate nephrolithiasis. No ureteral stones or hydronephrosis. Colonic diverticulosis. Hepatic steatosis. Electronically Signed   By: Franky Crease M.D.   On: 10/16/2023 11:49     EKG: None  Radiology: CT Renal Stone Study Result Date: 10/16/2023 CLINICAL DATA:  Left flank pain EXAM: CT ABDOMEN AND PELVIS WITHOUT CONTRAST TECHNIQUE: Multidetector CT imaging of the abdomen and pelvis was performed following the standard protocol without IV contrast. RADIATION DOSE REDUCTION: This exam was performed according to the departmental dose-optimization program which includes automated exposure control, adjustment of the mA and/or kV according to patient size and/or use of iterative reconstruction technique. COMPARISON:  01/01/2023 FINDINGS: Lower chest: No acute findings Hepatobiliary: Diffuse low-density throughout the liver compatible with fatty infiltration. No focal abnormality. Gallbladder unremarkable. Pancreas: No focal abnormality or ductal dilatation. Spleen: No focal abnormality.  Normal size. Adrenals/Urinary Tract: Punctate left midpole nephrolithiasis. No ureteral stones or hydronephrosis. No renal or adrenal abnormality. Urinary bladder unremarkable. Stomach/Bowel: Colonic diverticulosis most notable in the right colon and sigmoid colon. No active diverticulitis. Normal appendix. Stomach and small bowel decompressed. No bowel obstruction or inflammatory process. Vascular/Lymphatic: Scattered aortic atherosclerosis. No aneurysm or dissection. Reproductive: Uterus and adnexa unremarkable.  No mass. Other: No free fluid or free air.  Musculoskeletal: No acute bony abnormality. IMPRESSION: Left punctate nephrolithiasis. No ureteral stones or hydronephrosis. Colonic diverticulosis. Hepatic steatosis. Electronically Signed   By: Franky Crease M.D.   On: 10/16/2023 11:49     Procedures   Medications Ordered in the ED  oxyCODONE -acetaminophen  (PERCOCET/ROXICET) 5-325 MG per tablet 2 tablet (2 tablets Oral Given 10/16/23 1255)  ibuprofen  (ADVIL ) tablet 400 mg (400 mg Oral Given 10/16/23 1255)                                    Medical Decision Making Problems Addressed: Acute left-sided low back pain, unspecified whether sciatica present: acute illness or injury with systemic symptoms that poses a threat to life or bodily functions Left flank pain: acute illness or injury with systemic symptoms  Amount and/or Complexity of Data Reviewed Independent Historian:     Details: Family, hx External Data Reviewed: notes. Labs: ordered. Decision-making details documented in ED Course. Radiology: ordered and independent interpretation performed. Decision-making details documented in ED Course.  Risk Prescription drug management. Decision regarding hospitalization.   Labs ordered/sent. Imaging ordered.   Differential diagnosis includes ureteral stone, ddd, msk pain, etc. Dispo decision including potential need for admission considered - will get labs and imaging and reassess.   Reviewed nursing notes and prior charts for additional history. External reports reviewed.   Labs reviewed/interpreted by me - no uti.   CT reviewed/interpreted by me - no ureteral stone. Discussed ct w pt, tiny renal stone but no ureteral stone or acute process.   Pain seems msk in nature, worse w change of position, turning, sitting from lying, lying from sitting, etc  Percocet po. Ibuprofen  po. Po fluids.  Pt comfortable and appears stable for ED d/c.   Rec close pcp f/u.  Return precautions provided.       Final diagnoses:  Acute  left-sided low back pain, unspecified whether sciatica present  Left flank pain    ED Discharge Orders  None          Bernard Drivers, MD 10/16/23 1348

## 2023-10-16 NOTE — ED Notes (Signed)
 Pt did not want to wait for physician to come back- pt had husband push her out in wheel chair.

## 2023-10-16 NOTE — Discharge Instructions (Addendum)
 It was our pleasure to provide your ER care today - we hope that you feel better.  Avoid bending at waist or heavy lifting > 20 lbs for the next week.  Try gentle massage and/or heat  therapy to sore area. Take acetaminophen  or ibuprofen  as need for pain. You may also take robaxin  as need for muscle pain/spasm or ultram  as need for pain - no driving for the next 6 hours,  when taking these meds.   Follow up with primary care doctor in the next 1-2 weeks if symptoms fail to improve/resolve.  Return to ER if worse, new symptoms, fevers, severe/intractable pain, numbness/weakness, problems with bowel/bladder function,  or other concern.

## 2023-10-16 NOTE — ED Notes (Signed)
 Pt provided discharge instructions. This paramedic at bedside attempted to assist patient with wheel chair. Once in wheel chair, pt stated I'm going to leave here and go to another hospital where I will actually be taken care of. Pt began to start hyperventilating and reporting shortness of breath.   Pt took off her clothing again and ambulated back to bed. MD notified via secure chat. Symptoms of SHOB resolved upon pt relocating back to bed.

## 2023-10-17 ENCOUNTER — Other Ambulatory Visit: Payer: Self-pay | Admitting: Family Medicine

## 2023-10-17 DIAGNOSIS — M545 Low back pain, unspecified: Secondary | ICD-10-CM | POA: Diagnosis not present

## 2023-10-17 DIAGNOSIS — Z1231 Encounter for screening mammogram for malignant neoplasm of breast: Secondary | ICD-10-CM

## 2023-10-20 DIAGNOSIS — D259 Leiomyoma of uterus, unspecified: Secondary | ICD-10-CM | POA: Diagnosis not present

## 2023-10-20 DIAGNOSIS — R102 Pelvic and perineal pain: Secondary | ICD-10-CM | POA: Diagnosis not present

## 2023-10-20 DIAGNOSIS — N950 Postmenopausal bleeding: Secondary | ICD-10-CM | POA: Diagnosis not present

## 2023-11-24 DIAGNOSIS — D219 Benign neoplasm of connective and other soft tissue, unspecified: Secondary | ICD-10-CM | POA: Diagnosis not present

## 2023-11-27 ENCOUNTER — Other Ambulatory Visit (HOSPITAL_BASED_OUTPATIENT_CLINIC_OR_DEPARTMENT_OTHER): Payer: Self-pay

## 2023-11-27 DIAGNOSIS — E559 Vitamin D deficiency, unspecified: Secondary | ICD-10-CM | POA: Diagnosis not present

## 2023-11-27 DIAGNOSIS — Z23 Encounter for immunization: Secondary | ICD-10-CM | POA: Diagnosis not present

## 2023-11-27 DIAGNOSIS — Z0000 Encounter for general adult medical examination without abnormal findings: Secondary | ICD-10-CM | POA: Diagnosis not present

## 2023-11-27 DIAGNOSIS — M545 Low back pain, unspecified: Secondary | ICD-10-CM | POA: Diagnosis not present

## 2023-11-27 DIAGNOSIS — E1165 Type 2 diabetes mellitus with hyperglycemia: Secondary | ICD-10-CM | POA: Diagnosis not present

## 2023-11-27 MED ORDER — OMEPRAZOLE 40 MG PO CPDR
40.0000 mg | DELAYED_RELEASE_CAPSULE | ORAL | 3 refills | Status: AC
  Filled 2023-11-27 – 2023-12-23 (×2): qty 90, 90d supply, fill #0

## 2023-11-27 MED ORDER — METHOCARBAMOL 750 MG PO TABS
750.0000 mg | ORAL_TABLET | Freq: Two times a day (BID) | ORAL | 3 refills | Status: AC | PRN
  Filled 2023-11-27: qty 30, 15d supply, fill #0

## 2023-12-01 ENCOUNTER — Other Ambulatory Visit (HOSPITAL_BASED_OUTPATIENT_CLINIC_OR_DEPARTMENT_OTHER): Payer: Self-pay

## 2023-12-04 ENCOUNTER — Other Ambulatory Visit (HOSPITAL_BASED_OUTPATIENT_CLINIC_OR_DEPARTMENT_OTHER): Payer: Self-pay

## 2023-12-04 MED ORDER — VITAMIN D 1.25 MG (50000 UT) PO CAPS
1.0000 | ORAL_CAPSULE | ORAL | 11 refills | Status: AC
  Filled 2023-12-04: qty 4, 28d supply, fill #0
  Filled 2024-01-02 – 2024-01-20 (×2): qty 4, 28d supply, fill #1
  Filled 2024-02-24: qty 4, 28d supply, fill #2

## 2023-12-10 ENCOUNTER — Other Ambulatory Visit (HOSPITAL_BASED_OUTPATIENT_CLINIC_OR_DEPARTMENT_OTHER): Payer: Self-pay

## 2023-12-23 ENCOUNTER — Other Ambulatory Visit (HOSPITAL_BASED_OUTPATIENT_CLINIC_OR_DEPARTMENT_OTHER): Payer: Self-pay

## 2023-12-24 ENCOUNTER — Other Ambulatory Visit (HOSPITAL_BASED_OUTPATIENT_CLINIC_OR_DEPARTMENT_OTHER): Payer: Self-pay

## 2023-12-24 MED ORDER — ROSUVASTATIN CALCIUM 5 MG PO TABS
5.0000 mg | ORAL_TABLET | Freq: Every day | ORAL | 5 refills | Status: AC
  Filled 2023-12-24: qty 30, 30d supply, fill #0
  Filled 2024-01-20: qty 30, 30d supply, fill #1
  Filled 2024-02-19: qty 90, 90d supply, fill #2

## 2023-12-25 ENCOUNTER — Other Ambulatory Visit: Payer: Self-pay

## 2023-12-25 ENCOUNTER — Other Ambulatory Visit (HOSPITAL_BASED_OUTPATIENT_CLINIC_OR_DEPARTMENT_OTHER): Payer: Self-pay

## 2023-12-25 MED ORDER — OZEMPIC (2 MG/DOSE) 8 MG/3ML ~~LOC~~ SOPN
2.0000 mg | PEN_INJECTOR | SUBCUTANEOUS | 1 refills | Status: DC
  Filled 2023-12-25: qty 9, 84d supply, fill #0

## 2023-12-25 MED ORDER — METFORMIN HCL ER 500 MG PO TB24
1000.0000 mg | ORAL_TABLET | Freq: Every day | ORAL | 1 refills | Status: AC
  Filled 2023-12-25: qty 180, 90d supply, fill #0

## 2023-12-29 ENCOUNTER — Ambulatory Visit

## 2024-01-02 ENCOUNTER — Other Ambulatory Visit (HOSPITAL_BASED_OUTPATIENT_CLINIC_OR_DEPARTMENT_OTHER): Payer: Self-pay

## 2024-01-14 ENCOUNTER — Other Ambulatory Visit (HOSPITAL_BASED_OUTPATIENT_CLINIC_OR_DEPARTMENT_OTHER): Payer: Self-pay

## 2024-01-20 ENCOUNTER — Ambulatory Visit
Admission: RE | Admit: 2024-01-20 | Discharge: 2024-01-20 | Disposition: A | Source: Ambulatory Visit | Attending: Family Medicine | Admitting: Family Medicine

## 2024-01-20 ENCOUNTER — Other Ambulatory Visit (HOSPITAL_BASED_OUTPATIENT_CLINIC_OR_DEPARTMENT_OTHER): Payer: Self-pay

## 2024-01-20 DIAGNOSIS — Z1231 Encounter for screening mammogram for malignant neoplasm of breast: Secondary | ICD-10-CM | POA: Diagnosis not present

## 2024-02-17 ENCOUNTER — Other Ambulatory Visit (HOSPITAL_BASED_OUTPATIENT_CLINIC_OR_DEPARTMENT_OTHER): Payer: Self-pay

## 2024-02-17 MED ORDER — MOUNJARO 5 MG/0.5ML ~~LOC~~ SOAJ
5.0000 mg | SUBCUTANEOUS | 3 refills | Status: AC
  Filled 2024-02-17: qty 2, 28d supply, fill #0

## 2024-02-19 ENCOUNTER — Other Ambulatory Visit (HOSPITAL_BASED_OUTPATIENT_CLINIC_OR_DEPARTMENT_OTHER): Payer: Self-pay

## 2024-02-24 ENCOUNTER — Other Ambulatory Visit (HOSPITAL_BASED_OUTPATIENT_CLINIC_OR_DEPARTMENT_OTHER): Payer: Self-pay

## 2024-02-25 ENCOUNTER — Other Ambulatory Visit (HOSPITAL_BASED_OUTPATIENT_CLINIC_OR_DEPARTMENT_OTHER): Payer: Self-pay
